# Patient Record
Sex: Male | Born: 1950 | Race: White | Hispanic: No | Marital: Single | Smoking: Former smoker
Health system: Southern US, Community
[De-identification: ages and names within clinical notes are randomized; demographics above are authoritative.]

## PROBLEM LIST (undated history)

## (undated) DIAGNOSIS — I10 Essential (primary) hypertension: Secondary | ICD-10-CM

## (undated) DIAGNOSIS — L409 Psoriasis, unspecified: Secondary | ICD-10-CM

## (undated) DIAGNOSIS — M1990 Unspecified osteoarthritis, unspecified site: Secondary | ICD-10-CM

## (undated) DIAGNOSIS — K759 Inflammatory liver disease, unspecified: Secondary | ICD-10-CM

## (undated) DIAGNOSIS — K219 Gastro-esophageal reflux disease without esophagitis: Secondary | ICD-10-CM

## (undated) DIAGNOSIS — J189 Pneumonia, unspecified organism: Secondary | ICD-10-CM

## (undated) HISTORY — PX: CHOLECYSTECTOMY: SHX55

---

## 2013-01-27 ENCOUNTER — Emergency Department: Payer: Self-pay | Admitting: Emergency Medicine

## 2013-02-03 ENCOUNTER — Emergency Department: Payer: Self-pay | Admitting: Internal Medicine

## 2014-01-16 ENCOUNTER — Emergency Department: Payer: Self-pay | Admitting: Emergency Medicine

## 2014-01-16 LAB — CBC WITH DIFFERENTIAL/PLATELET
BASOS ABS: 0 10*3/uL (ref 0.0–0.1)
Basophil %: 0.4 %
EOS ABS: 0 10*3/uL (ref 0.0–0.7)
EOS PCT: 0.1 %
HCT: 42.7 % (ref 40.0–52.0)
HGB: 14.2 g/dL (ref 13.0–18.0)
Lymphocyte #: 0.5 10*3/uL — ABNORMAL LOW (ref 1.0–3.6)
Lymphocyte %: 8.4 %
MCH: 29.6 pg (ref 26.0–34.0)
MCHC: 33.2 g/dL (ref 32.0–36.0)
MCV: 89 fL (ref 80–100)
Monocyte #: 0.3 x10 3/mm (ref 0.2–1.0)
Monocyte %: 4.2 %
Neutrophil #: 5.5 10*3/uL (ref 1.4–6.5)
Neutrophil %: 86.9 %
PLATELETS: 176 10*3/uL (ref 150–440)
RBC: 4.79 10*6/uL (ref 4.40–5.90)
RDW: 13.9 % (ref 11.5–14.5)
WBC: 6.3 10*3/uL (ref 3.8–10.6)

## 2014-01-16 LAB — COMPREHENSIVE METABOLIC PANEL
ALK PHOS: 165 U/L — AB
AST: 225 U/L — AB (ref 15–37)
Albumin: 3.7 g/dL (ref 3.4–5.0)
Anion Gap: 12 (ref 7–16)
BILIRUBIN TOTAL: 2.8 mg/dL — AB (ref 0.2–1.0)
BUN: 11 mg/dL (ref 7–18)
CALCIUM: 9 mg/dL (ref 8.5–10.1)
Chloride: 105 mmol/L (ref 98–107)
Co2: 25 mmol/L (ref 21–32)
Creatinine: 0.87 mg/dL (ref 0.60–1.30)
EGFR (African American): >60
EGFR (Non-African Amer.): >60
GLUCOSE: 160 mg/dL — AB (ref 65–99)
OSMOLALITY: 286 (ref 275–301)
POTASSIUM: 3.8 mmol/L (ref 3.5–5.1)
SGPT (ALT): 113 U/L — ABNORMAL HIGH
Sodium: 142 mmol/L (ref 136–145)
TOTAL PROTEIN: 7.4 g/dL (ref 6.4–8.2)

## 2014-01-16 LAB — LIPASE, BLOOD: LIPASE: 121 U/L (ref 73–393)

## 2014-01-16 LAB — URINALYSIS, COMPLETE
Bacteria: NONE SEEN
Bilirubin,UR: NEGATIVE
Blood: NEGATIVE
GLUCOSE, UR: NEGATIVE mg/dL (ref 0–75)
Leukocyte Esterase: NEGATIVE
NITRITE: NEGATIVE
Ph: 6 (ref 4.5–8.0)
Protein: NEGATIVE
RBC,UR: 3 /HPF (ref 0–5)
SPECIFIC GRAVITY: 1.021 (ref 1.003–1.030)
WBC UR: 2 /HPF (ref 0–5)

## 2014-01-16 LAB — TROPONIN I

## 2014-02-01 ENCOUNTER — Ambulatory Visit: Payer: Self-pay | Admitting: Gastroenterology

## 2014-03-12 ENCOUNTER — Inpatient Hospital Stay (HOSPITAL_COMMUNITY): Payer: PRIVATE HEALTH INSURANCE

## 2014-03-12 ENCOUNTER — Inpatient Hospital Stay (HOSPITAL_COMMUNITY)
Admission: AD | Admit: 2014-03-12 | Discharge: 2014-03-14 | DRG: 445 | Disposition: A | Discharge status: Home / Self Care | Payer: PRIVATE HEALTH INSURANCE | Source: Other Acute Inpatient Hospital | Attending: Internal Medicine | Admitting: Internal Medicine

## 2014-03-12 ENCOUNTER — Emergency Department: Payer: Self-pay | Admitting: Emergency Medicine

## 2014-03-12 DIAGNOSIS — K219 Gastro-esophageal reflux disease without esophagitis: Secondary | ICD-10-CM | POA: Diagnosis present

## 2014-03-12 DIAGNOSIS — R109 Unspecified abdominal pain: Secondary | ICD-10-CM

## 2014-03-12 DIAGNOSIS — L409 Psoriasis, unspecified: Secondary | ICD-10-CM | POA: Diagnosis present

## 2014-03-12 DIAGNOSIS — Z833 Family history of diabetes mellitus: Secondary | ICD-10-CM

## 2014-03-12 DIAGNOSIS — R1084 Generalized abdominal pain: Secondary | ICD-10-CM

## 2014-03-12 DIAGNOSIS — K760 Fatty (change of) liver, not elsewhere classified: Secondary | ICD-10-CM | POA: Diagnosis present

## 2014-03-12 DIAGNOSIS — R1011 Right upper quadrant pain: Secondary | ICD-10-CM | POA: Diagnosis present

## 2014-03-12 DIAGNOSIS — C642 Malignant neoplasm of left kidney, except renal pelvis: Secondary | ICD-10-CM | POA: Diagnosis present

## 2014-03-12 DIAGNOSIS — R740 Nonspecific elevation of levels of transaminase and lactic acid dehydrogenase [LDH]: Secondary | ICD-10-CM | POA: Diagnosis present

## 2014-03-12 DIAGNOSIS — K8050 Calculus of bile duct without cholangitis or cholecystitis without obstruction: Secondary | ICD-10-CM | POA: Diagnosis present

## 2014-03-12 DIAGNOSIS — Z66 Do not resuscitate: Secondary | ICD-10-CM | POA: Diagnosis present

## 2014-03-12 DIAGNOSIS — K8040 Calculus of bile duct with cholecystitis, unspecified, without obstruction: Secondary | ICD-10-CM

## 2014-03-12 DIAGNOSIS — Z8249 Family history of ischemic heart disease and other diseases of the circulatory system: Secondary | ICD-10-CM

## 2014-03-12 DIAGNOSIS — R739 Hyperglycemia, unspecified: Secondary | ICD-10-CM | POA: Diagnosis present

## 2014-03-12 DIAGNOSIS — R7401 Elevation of levels of liver transaminase levels: Secondary | ICD-10-CM | POA: Diagnosis present

## 2014-03-12 DIAGNOSIS — I10 Essential (primary) hypertension: Secondary | ICD-10-CM | POA: Diagnosis present

## 2014-03-12 HISTORY — DX: Psoriasis, unspecified: L40.9

## 2014-03-12 LAB — URINALYSIS, COMPLETE
Bacteria: NONE SEEN
Bilirubin,UR: NEGATIVE
Blood: NEGATIVE
Glucose,UR: NEGATIVE mg/dL (ref 0–75)
LEUKOCYTE ESTERASE: NEGATIVE
Nitrite: NEGATIVE
PROTEIN: NEGATIVE
Ph: 5 (ref 4.5–8.0)
RBC,UR: 1 /HPF (ref 0–5)
Specific Gravity: 1.031 (ref 1.003–1.030)
WBC UR: 1 /HPF (ref 0–5)

## 2014-03-12 LAB — COMPREHENSIVE METABOLIC PANEL
ALBUMIN: 3.6 g/dL (ref 3.4–5.0)
ALBUMIN: 3.6 g/dL (ref 3.5–5.2)
ALT: 129 U/L — ABNORMAL HIGH (ref 0–53)
ANION GAP: 15 (ref 5–15)
AST: 170 U/L — AB (ref 0–37)
AST: 188 U/L — AB (ref 15–37)
Alkaline Phosphatase: 168 U/L — ABNORMAL HIGH
Alkaline Phosphatase: 184 U/L — ABNORMAL HIGH (ref 39–117)
Anion Gap: 9 (ref 7–16)
BUN: 11 mg/dL (ref 7–18)
BUN: 10 mg/dL (ref 6–23)
Bilirubin,Total: 2.8 mg/dL — ABNORMAL HIGH (ref 0.2–1.0)
CALCIUM: 8.4 mg/dL — AB (ref 8.5–10.1)
CALCIUM: 9.1 mg/dL (ref 8.4–10.5)
CHLORIDE: 107 mmol/L (ref 98–107)
CO2: 25 mEq/L (ref 19–32)
CREATININE: 0.76 mg/dL (ref 0.50–1.35)
Chloride: 101 mEq/L (ref 96–112)
Co2: 26 mmol/L (ref 21–32)
Creatinine: 0.95 mg/dL (ref 0.60–1.30)
EGFR (African American): >60
EGFR (Non-African Amer.): >60
GFR calc Af Amer: >90 mL/min (ref >90)
GFR calc non Af Amer: >90 mL/min (ref >90)
GLUCOSE: 132 mg/dL — AB (ref 65–99)
Glucose, Bld: 120 mg/dL — ABNORMAL HIGH (ref 70–99)
Osmolality: 284 (ref 275–301)
Potassium: 3.7 mmol/L (ref 3.5–5.1)
Potassium: 3.9 mEq/L (ref 3.7–5.3)
SGPT (ALT): 102 U/L — ABNORMAL HIGH
SODIUM: 142 mmol/L (ref 136–145)
Sodium: 141 mEq/L (ref 137–147)
TOTAL PROTEIN: 7.2 g/dL (ref 6.0–8.3)
Total Bilirubin: 2.5 mg/dL — ABNORMAL HIGH (ref 0.3–1.2)
Total Protein: 7.2 g/dL (ref 6.4–8.2)

## 2014-03-12 LAB — CBC WITH DIFFERENTIAL/PLATELET
Basophil #: 0 10*3/uL (ref 0.0–0.1)
Basophil %: 0.4 %
Basophils Absolute: 0 10*3/uL (ref 0.0–0.1)
Basophils Relative: 0 % (ref 0–1)
Eosinophil #: 0 10*3/uL (ref 0.0–0.7)
Eosinophil %: 0.2 %
Eosinophils Absolute: 0 10*3/uL (ref 0.0–0.7)
Eosinophils Relative: 0 % (ref 0–5)
HCT: 40.8 % (ref 39.0–52.0)
HCT: 42.9 % (ref 40.0–52.0)
HEMOGLOBIN: 13.7 g/dL (ref 13.0–17.0)
HGB: 14.3 g/dL (ref 13.0–18.0)
LYMPHS ABS: 0.7 10*3/uL — AB (ref 1.0–3.6)
LYMPHS ABS: 0.9 10*3/uL (ref 0.7–4.0)
Lymphocyte %: 10.2 %
Lymphocytes Relative: 14 % (ref 12–46)
MCH: 29.7 pg (ref 26.0–34.0)
MCH: 29.3 pg (ref 26.0–34.0)
MCHC: 33.4 g/dL (ref 32.0–36.0)
MCHC: 33.6 g/dL (ref 30.0–36.0)
MCV: 87.2 fL (ref 78.0–100.0)
MCV: 89 fL (ref 80–100)
Monocyte #: 0.4 x10 3/mm (ref 0.2–1.0)
Monocyte %: 5.1 %
Monocytes Absolute: 0.4 10*3/uL (ref 0.1–1.0)
Monocytes Relative: 6 % (ref 3–12)
NEUTROS ABS: 5.1 10*3/uL (ref 1.7–7.7)
NEUTROS PCT: 80 % — AB (ref 43–77)
Neutrophil #: 5.9 10*3/uL (ref 1.4–6.5)
Neutrophil %: 84.1 %
Platelet: 164 10*3/uL (ref 150–440)
Platelets: 171 10*3/uL (ref 150–400)
RBC: 4.81 10*6/uL (ref 4.40–5.90)
RBC: 4.68 MIL/uL (ref 4.22–5.81)
RDW: 13.6 % (ref 11.5–14.5)
RDW: 13.1 % (ref 11.5–15.5)
WBC: 7 10*3/uL (ref 3.8–10.6)
WBC: 6.4 10*3/uL (ref 4.0–10.5)

## 2014-03-12 LAB — PHOSPHORUS: PHOSPHORUS: 2.7 mg/dL (ref 2.3–4.6)

## 2014-03-12 LAB — LIPASE, BLOOD: LIPASE: 105 U/L (ref 73–393)

## 2014-03-12 LAB — APTT: aPTT: 34 seconds (ref 24–37)

## 2014-03-12 LAB — MAGNESIUM: MAGNESIUM: 1.9 mg/dL (ref 1.5–2.5)

## 2014-03-12 LAB — PROTIME-INR
INR: 1.17 (ref 0.00–1.49)
PROTHROMBIN TIME: 14.9 s (ref 11.6–15.2)

## 2014-03-12 LAB — TSH: TSH: 0.732 u[IU]/mL (ref 0.350–4.500)

## 2014-03-12 LAB — TROPONIN I

## 2014-03-12 MED ORDER — ONDANSETRON HCL 4 MG/2ML IJ SOLN
4.0000 mg | Freq: Four times a day (QID) | INTRAMUSCULAR | Status: DC | PRN
Start: 2014-03-12 — End: 2014-03-14
  Administered 2014-03-12: 4 mg via INTRAVENOUS
  Filled 2014-03-12: qty 2

## 2014-03-12 MED ORDER — MORPHINE SULFATE 2 MG/ML IJ SOLN
2.0000 mg | INTRAMUSCULAR | Status: DC | PRN
  Administered 2014-03-13: 2 mg via INTRAVENOUS
  Filled 2014-03-12: qty 1

## 2014-03-12 MED ORDER — ACETAMINOPHEN 325 MG PO TABS
650.0000 mg | ORAL_TABLET | Freq: Four times a day (QID) | ORAL | Status: DC | PRN
  Administered 2014-03-13 – 2014-03-14 (×2): 650 mg via ORAL
  Filled 2014-03-12 (×2): qty 2

## 2014-03-12 MED ORDER — HYDRALAZINE HCL 20 MG/ML IJ SOLN: 10.0000 mg | Freq: Four times a day (QID) | INTRAMUSCULAR | Status: DC | PRN

## 2014-03-12 MED ORDER — PANTOPRAZOLE SODIUM 40 MG IV SOLR
40.0000 mg | Freq: Every day | INTRAVENOUS | Status: DC
  Administered 2014-03-12 – 2014-03-14 (×3): 40 mg via INTRAVENOUS
  Filled 2014-03-12 (×3): qty 40

## 2014-03-12 MED ORDER — ONDANSETRON HCL 4 MG PO TABS: 4.0000 mg | ORAL_TABLET | Freq: Four times a day (QID) | ORAL | Status: DC | PRN

## 2014-03-12 MED ORDER — GADOBENATE DIMEGLUMINE 529 MG/ML IV SOLN
20.0000 mL | Freq: Once | INTRAVENOUS | Status: AC | PRN
  Administered 2014-03-12: 20 mL via INTRAVENOUS

## 2014-03-12 MED ORDER — METOPROLOL TARTRATE 1 MG/ML IV SOLN
2.5000 mg | Freq: Three times a day (TID) | INTRAVENOUS | Status: DC
  Administered 2014-03-12 – 2014-03-14 (×4): 2.5 mg via INTRAVENOUS
  Filled 2014-03-12 (×9): qty 5

## 2014-03-12 MED ORDER — HEPARIN SODIUM (PORCINE) 5000 UNIT/ML IJ SOLN
5000.0000 [IU] | Freq: Three times a day (TID) | INTRAMUSCULAR | Status: DC
  Administered 2014-03-12 – 2014-03-14 (×5): 5000 [IU] via SUBCUTANEOUS
  Filled 2014-03-12 (×9): qty 1

## 2014-03-12 MED ORDER — ACETAMINOPHEN 650 MG RE SUPP: 650.0000 mg | Freq: Four times a day (QID) | RECTAL | Status: DC | PRN

## 2014-03-12 MED ORDER — KCL IN DEXTROSE-NACL 20-5-0.45 MEQ/L-%-% IV SOLN
INTRAVENOUS | Status: DC
  Administered 2014-03-12 – 2014-03-13 (×2): via INTRAVENOUS
  Administered 2014-03-14: 50 mL/h via INTRAVENOUS
  Filled 2014-03-12 (×5): qty 1000

## 2014-03-12 NOTE — Progress Notes (Signed)
Pt not in room . No notes in chart, possibly getting MRI. Will return this PM or AM for consult.

## 2014-03-12 NOTE — H&P (Signed)
[facility] History and Physical  Miguel Ingram PFX:[id number] DOB: 15-Jul-1950 DOA: 03/12/2014  Referring physician: Dr. Reita Cliche PCP: No primary provider on file.   Chief Complaint: abd pain, nausea, vomiting  HPI: Miguel Ingram is a 63 y.o. male with PMH of psoriasis, cholecystectomy and HTN (currently not taking any medications); who presented to [facility] due to abd pain, nausea and vomiting. Pain localized in his RUQ and radiated to epigastric region; associated with nausea and vomiting. Patient denies hematemesis. Pain has been present for 1-2 days PTA and worsening. While in ED he was found to have transaminatis and mild elevation of bilirubin, CT demonstrated CBD dilatation and potential stone retention. MRCP was recommended and since not available at [facility] patient was transferred to [facility]. Patient is hemodynamically stable otherwise. No fever, chills, normal WBC's, no icterus.  [place] and incidental left renal mass was appreciated, and most likely representing RCC.   Review of Systems:  Negative except as mentioned on HPI.  PMH: HTN and psoriasis   surgical history: cholecystectomy   Social History:  has no tobacco, alcohol, and drug history on file. patient lives by himself, no married and no children. Able to perform all his ADL's.  Family History HTN, cholesterol and DM; Mother with CHF and Father (heavy smoker) with lung cancer. Otherwise non contributory   Prior to Admission medications   Iburpofen  Multivitamins    Physical Exam: Filed Vitals:   03/12/14 0915  BP: 189/89  Pulse: 70  Temp: 97.9 F (36.6 C)  TempSrc: Oral  Resp: 18  SpO2: 100%    Wt Readings from Last 3 Encounters:  No data found for Wt    General:  Appears calm and comfortable; reports pain on his RUQ is not that bad currently; also w/o N/V at this moment. No fever. Eyes: PERRL, normal lids, irises & conjunctiva; no icterus, no nystagmus ENT: grossly normal hearing, lips &  tongue, no erythema, exudates or any drainage out of ears or nostrils Neck: no LAD, masses or thyromegaly Cardiovascular: RRR, no m/r/g. No LE edema. Respiratory: CTA bilaterally, no w/r/r. Normal respiratory effort. Abdomen: soft, no distension, positive RUQ pain with palpation; positive BS Skin: no rash or induration seen on limited exam Musculoskeletal: grossly normal tone BUE/BLE Psychiatric: grossly normal mood and affect, speech fluent and appropriate Neurologic: grossly non-focal.          Labs on Admission:  Basic Metabolic Panel: No results found for this basename: NA, K, CL, CO2, GLUCOSE, BUN, CREATININE, CALCIUM, MG, PHOS,  in the last 168 hours Liver Function Tests: No results found for this basename: AST, ALT, ALKPHOS, BILITOT, PROT, ALBUMIN,  in the last 168 hours No results found for this basename: LIPASE, AMYLASE,  in the last 168 hours No results found for this basename: AMMONIA,  in the last 168 hours CBC: No results found for this basename: WBC, NEUTROABS, HGB, HCT, MCV, PLT,  in the last 168 hours Cardiac Enzymes: No results found for this basename: CKTOTAL, CKMB, CKMBINDEX, TROPONINI,  in the last 168 hours  BNP (last 3 results) No results found for this basename: PROBNP,  in the last 8760 hours CBG: No results found for this basename: GLUCAP,  in the last 168 hours  Radiological Exams on Admission: Mr 3d Recon At Scanner  03/12/2014   CLINICAL DATA:  Chronic abdominal pain, with acute worsening of symptoms for 18 hr. nausea and vomiting. Pain radiating to back. Suspected choledocholithiasis on CT. Left renal mass.  EXAM: MRI ABDOMEN WITHOUT AND WITH CONTRAST (INCLUDING MRCP)  TECHNIQUE: Multiplanar multisequence MR imaging of the abdomen was performed both before and after the administration of intravenous contrast. Heavily T2-weighted images of the biliary and pancreatic ducts were obtained, and three-dimensional MRCP images were rendered by post processing.   CONTRAST:  28mL MULTIHANCE GADOBENATE DIMEGLUMINE 529 MG/ML IV SOLN  COMPARISON:  CT on 03/12/2014  FINDINGS: Lower chest:  Small hiatal hernia noted.  Hepatobiliary: Diffuse hepatic steatosis noted on chemical shift imaging. A few tiny sub-cm right hepatic lobe cysts are noted, however no liver masses are identified.  Prior cholecystectomy noted. Mild diffuse biliary ductal dilatation is seen with common bile duct measuring approximately 9 mm and diffuse intrahepatic ductal dilatation also seen. A tiny less than 1 cm stone is seen in the distal common bile duct.  Pancreas: No mass, inflammatory changes, or other parenchymal abnormality identified.  Spleen:  Within normal limits in size and appearance.  Adrenal Glands:  No mass identified.  Kidneys: A large heterogeneously enhancing mass is seen involving the mid and lower pole of the left kidney which measures 9.2 x 12.3 cm, consistent with renal cell carcinoma. A few tiny right renal cysts are noted however no right-sided renal masses are identified.  Stomach/Bowel/Peritoneum: Visualized portions within the abdomen are unremarkable.  Vascular/Lymphatic: No pathologically enlarged lymph nodes identified. No other significant abnormality noted.  Other:  None.  Musculoskeletal:  No suspicious bone lesions identified.  IMPRESSION: Prior cholecystectomy. Mild diffuse biliary ductal dilatation, with small less than 1 cm distal common bile duct stone.  Diffuse hepatic steatosis.  12 cm left renal mass, consistent renal cell carcinoma. No evidence of metastatic disease within the abdomen.  Small hiatal hernia.   Electronically Signed   By: Earle Gell M.D.   On: 03/12/2014 15:05   Mr Abd W/wo Cm/mrcp  03/12/2014   CLINICAL DATA:  Chronic abdominal pain, with acute worsening of symptoms for 18 hr. nausea and vomiting. Pain radiating to back. Suspected choledocholithiasis on CT. Left renal mass.  EXAM: MRI ABDOMEN WITHOUT AND WITH CONTRAST (INCLUDING MRCP)  TECHNIQUE:  Multiplanar multisequence MR imaging of the abdomen was performed both before and after the administration of intravenous contrast. Heavily T2-weighted images of the biliary and pancreatic ducts were obtained, and three-dimensional MRCP images were rendered by post processing.  CONTRAST:  41mL MULTIHANCE GADOBENATE DIMEGLUMINE 529 MG/ML IV SOLN  COMPARISON:  CT on 03/12/2014  FINDINGS: Lower chest:  Small hiatal hernia noted.  Hepatobiliary: Diffuse hepatic steatosis noted on chemical shift imaging. A few tiny sub-cm right hepatic lobe cysts are noted, however no liver masses are identified.  Prior cholecystectomy noted. Mild diffuse biliary ductal dilatation is seen with common bile duct measuring approximately 9 mm and diffuse intrahepatic ductal dilatation also seen. A tiny less than 1 cm stone is seen in the distal common bile duct.  Pancreas: No mass, inflammatory changes, or other parenchymal abnormality identified.  Spleen:  Within normal limits in size and appearance.  Adrenal Glands:  No mass identified.  Kidneys: A large heterogeneously enhancing mass is seen involving the mid and lower pole of the left kidney which measures 9.2 x 12.3 cm, consistent with renal cell carcinoma. A few tiny right renal cysts are noted however no right-sided renal masses are identified.  Stomach/Bowel/Peritoneum: Visualized portions within the abdomen are unremarkable.  Vascular/Lymphatic: No pathologically enlarged lymph nodes identified. No other significant abnormality noted.  Other:  None.  Musculoskeletal:  No suspicious bone lesions  identified.  IMPRESSION: Prior cholecystectomy. Mild diffuse biliary ductal dilatation, with small less than 1 cm distal common bile duct stone.  Diffuse hepatic steatosis.  12 cm left renal mass, consistent renal cell carcinoma. No evidence of metastatic disease within the abdomen.  Small hiatal hernia.   Electronically Signed   By: Earle Gell M.D.   On: 03/12/2014 15:05    EKG:  None     Assessment/Plan 1-Abdominal pain, right upper quadrant, N/V: appears to be secondary to choledocholithiasis. Patient s/p cholecystectomy in the past. MRCP and CT abdomen demonstrating CBD dilatation and presence of a distal stone. -no fever, no WBC's; will hold on antibiotics -GI consulted as patient might required ERCP and sphincterectomy   -PRN antiemetics -PRN analgesics  -Supportive care -NPO except for ice-chips and sips  2-Transaminitis: AST 188, ALT 102 and alkaline phosphatase 168. -secondary to #1 -will monitor -no icterus on exam -will follow GI rec's  3-Benign essential HTN: will use low dose lopressor and PRN hydralazine  4-GERD and GI prophylaxis: will use protonix  5-incidental left renal cell carcinoma: approx size is 12cm -urology contacted and the plan is to set up immediate follow up as an outpatient for work up and decision for surgery -will need Bone scan as an outpatient -Calcium level WNL -no hydronephrosis  -Secretary on the floor to make appointment prior to discharge Phone [phone]  6-Hyperglycemia: w/o hx of diabetes -will check A1C   7-hepatic steatosis: incidental finding: will check lipid profile.   GI (Dr. Amedeo Plenty) Urology (Dr. Ishmael Holter)   Code Status: DNR DVT Prophylaxis:Heparin Family Communication: no family at bedside Disposition Plan: LOS > 2 midnights, inpatient, Med-Surg  Time spent: 44 minutes  Barton Dubois [facility] Pager [phone]

## 2014-03-12 NOTE — Consult Note (Signed)
[facility] Consult Note  Referring Provider: No ref. provider found Primary Care Physician:  No primary provider on file. Primary Gastroenterologist:  Dr.  Laurel Dimmer Complaint: Epigastric abdominal pain nausea and vomiting HPI: Miguel Ingram is an 63 y.o. white male  transferred from [facility] he presented with epigastric pain nausea and vomiting. He had elevated liver function tests, a normal lipase and CT suggestion of a distal common bile duct stone. He is status post cholecystectomy 2 or 3 years ago. He was also found to have a large left renal mass. He was transferred here and MRCP confirmed both a distal CBD stone in the left renal mass consistent with renal cell carcinoma. He is currently essentially pain-free after narcotics. No past medical history on file.  No past surgical history on file.  Medications Prior to Admission  Medication Sig Dispense Refill  . ibuprofen (ADVIL,MOTRIN) 200 MG tablet Take 200 mg by mouth every 6 (six) hours as needed for moderate pain.      . Multiple Vitamin (MULTIVITAMIN WITH MINERALS) TABS tablet Take 1 tablet by mouth daily.        Allergies:  Allergies  Allergen Reactions  . Shrimp [Shellfish Allergy] Nausea And Vomiting    No family history on file.  Social History:  has no tobacco, alcohol, and drug history on file.  Review of Systems: negative except as above   Blood pressure 148/83, pulse 68, temperature 97.9 F (36.6 C), temperature source Oral, resp. rate 18, height 6' (1.829 m), weight 97.523 kg (215 lb), SpO2 100.00%. Head: Normocephalic, without obvious abnormality, atraumatic Neck: no adenopathy, no carotid bruit, no JVD, supple, symmetrical, trachea midline and thyroid not enlarged, symmetric, no tenderness/mass/nodules Resp: clear to auscultation bilaterally Cardio: regular rate and rhythm, S1, S2 normal, no murmur, click, rub or gallop GI: Abdomen soft minimally tender in the epigastric, no hepatosplenomegaly  mass or guarding Extremities: extremities normal, atraumatic, no cyanosis or edema  Results for orders placed during the hospital encounter of 03/12/14 (from the past 48 hour(s))  CBC WITH DIFFERENTIAL     Status: Abnormal   Collection Time    03/12/14  4:40 PM      Result Value Ref Range   WBC 6.4  4.0 - 10.5 K/uL   RBC 4.68  4.22 - 5.81 MIL/uL   Hemoglobin 13.7  13.0 - 17.0 g/dL   HCT 40.8  39.0 - 52.0 %   MCV 87.2  78.0 - 100.0 fL   MCH 29.3  26.0 - 34.0 pg   MCHC 33.6  30.0 - 36.0 g/dL   RDW 13.1  11.5 - 15.5 %   Platelets 171  150 - 400 K/uL   Neutrophils Relative % 80 (*) 43 - 77 %   Neutro Abs 5.1  1.7 - 7.7 K/uL   Lymphocytes Relative 14  12 - 46 %   Lymphs Abs 0.9  0.7 - 4.0 K/uL   Monocytes Relative 6  3 - 12 %   Monocytes Absolute 0.4  0.1 - 1.0 K/uL   Eosinophils Relative 0  0 - 5 %   Eosinophils Absolute 0.0  0.0 - 0.7 K/uL   Basophils Relative 0  0 - 1 %   Basophils Absolute 0.0  0.0 - 0.1 K/uL  PROTIME-INR     Status: None   Collection Time    03/12/14  4:40 PM      Result Value Ref Range   Prothrombin Time 14.9  11.6 - 15.2 seconds  INR 1.17  0.00 - 1.49  APTT     Status: None   Collection Time    03/12/14  4:40 PM      Result Value Ref Range   aPTT 34  24 - 37 seconds   Mr 3d Recon At Scanner  03/12/2014   CLINICAL DATA:  Chronic abdominal pain, with acute worsening of symptoms for 18 hr. nausea and vomiting. Pain radiating to back. Suspected choledocholithiasis on CT. Left renal mass.  EXAM: MRI ABDOMEN WITHOUT AND WITH CONTRAST (INCLUDING MRCP)  TECHNIQUE: Multiplanar multisequence MR imaging of the abdomen was performed both before and after the administration of intravenous contrast. Heavily T2-weighted images of the biliary and pancreatic ducts were obtained, and three-dimensional MRCP images were rendered by post processing.  CONTRAST:  57mL MULTIHANCE GADOBENATE DIMEGLUMINE 529 MG/ML IV SOLN  COMPARISON:  CT on 03/12/2014  FINDINGS: Lower chest:   Small hiatal hernia noted.  Hepatobiliary: Diffuse hepatic steatosis noted on chemical shift imaging. A few tiny sub-cm right hepatic lobe cysts are noted, however no liver masses are identified.  Prior cholecystectomy noted. Mild diffuse biliary ductal dilatation is seen with common bile duct measuring approximately 9 mm and diffuse intrahepatic ductal dilatation also seen. A tiny less than 1 cm stone is seen in the distal common bile duct.  Pancreas: No mass, inflammatory changes, or other parenchymal abnormality identified.  Spleen:  Within normal limits in size and appearance.  Adrenal Glands:  No mass identified.  Kidneys: A large heterogeneously enhancing mass is seen involving the mid and lower pole of the left kidney which measures 9.2 x 12.3 cm, consistent with renal cell carcinoma. A few tiny right renal cysts are noted however no right-sided renal masses are identified.  Stomach/Bowel/Peritoneum: Visualized portions within the abdomen are unremarkable.  Vascular/Lymphatic: No pathologically enlarged lymph nodes identified. No other significant abnormality noted.  Other:  None.  Musculoskeletal:  No suspicious bone lesions identified.  IMPRESSION: Prior cholecystectomy. Mild diffuse biliary ductal dilatation, with small less than 1 cm distal common bile duct stone.  Diffuse hepatic steatosis.  12 cm left renal mass, consistent renal cell carcinoma. No evidence of metastatic disease within the abdomen.  Small hiatal hernia.   Electronically Signed   By: Earle Gell M.D.   On: 03/12/2014 15:05   Mr Abd W/wo Cm/mrcp  03/12/2014   CLINICAL DATA:  Chronic abdominal pain, with acute worsening of symptoms for 18 hr. nausea and vomiting. Pain radiating to back. Suspected choledocholithiasis on CT. Left renal mass.  EXAM: MRI ABDOMEN WITHOUT AND WITH CONTRAST (INCLUDING MRCP)  TECHNIQUE: Multiplanar multisequence MR imaging of the abdomen was performed both before and after the administration of intravenous  contrast. Heavily T2-weighted images of the biliary and pancreatic ducts were obtained, and three-dimensional MRCP images were rendered by post processing.  CONTRAST:  16mL MULTIHANCE GADOBENATE DIMEGLUMINE 529 MG/ML IV SOLN  COMPARISON:  CT on 03/12/2014  FINDINGS: Lower chest:  Small hiatal hernia noted.  Hepatobiliary: Diffuse hepatic steatosis noted on chemical shift imaging. A few tiny sub-cm right hepatic lobe cysts are noted, however no liver masses are identified.  Prior cholecystectomy noted. Mild diffuse biliary ductal dilatation is seen with common bile duct measuring approximately 9 mm and diffuse intrahepatic ductal dilatation also seen. A tiny less than 1 cm stone is seen in the distal common bile duct.  Pancreas: No mass, inflammatory changes, or other parenchymal abnormality identified.  Spleen:  Within normal limits in size and appearance.  Adrenal  Glands:  No mass identified.  Kidneys: A large heterogeneously enhancing mass is seen involving the mid and lower pole of the left kidney which measures 9.2 x 12.3 cm, consistent with renal cell carcinoma. A few tiny right renal cysts are noted however no right-sided renal masses are identified.  Stomach/Bowel/Peritoneum: Visualized portions within the abdomen are unremarkable.  Vascular/Lymphatic: No pathologically enlarged lymph nodes identified. No other significant abnormality noted.  Other:  None.  Musculoskeletal:  No suspicious bone lesions identified.  IMPRESSION: Prior cholecystectomy. Mild diffuse biliary ductal dilatation, with small less than 1 cm distal common bile duct stone.  Diffuse hepatic steatosis.  12 cm left renal mass, consistent renal cell carcinoma. No evidence of metastatic disease within the abdomen.  Small hiatal hernia.   Electronically Signed   By: Earle Gell M.D.   On: 03/12/2014 15:05    Assessment: 1. Common bile duct stone 2. Left renal mass suspicious for renal cell carcinoma Plan:  1. Will plan ERCP hopefully  tomorrow or Tuesday at the latest. Risks, rationale and alternatives were explained the patient and he wishes to proceed 2. Workup of renal mass per urology who is been contacted by the hospitalist. Madysyn Hanken,Bon C 03/12/2014, 5:54 PM

## 2014-03-13 ENCOUNTER — Inpatient Hospital Stay (HOSPITAL_COMMUNITY): Payer: PRIVATE HEALTH INSURANCE

## 2014-03-13 ENCOUNTER — Encounter (HOSPITAL_COMMUNITY): Payer: Self-pay | Admitting: *Deleted

## 2014-03-13 ENCOUNTER — Inpatient Hospital Stay (HOSPITAL_COMMUNITY): Payer: PRIVATE HEALTH INSURANCE | Admitting: Certified Registered"

## 2014-03-13 ENCOUNTER — Encounter (HOSPITAL_COMMUNITY)
Admission: AD | Disposition: A | Discharge status: Home / Self Care | Payer: Self-pay | Source: Other Acute Inpatient Hospital | Attending: Internal Medicine

## 2014-03-13 ENCOUNTER — Encounter (HOSPITAL_COMMUNITY): Payer: PRIVATE HEALTH INSURANCE | Admitting: Certified Registered"

## 2014-03-13 HISTORY — PX: ERCP: SHX5425

## 2014-03-13 LAB — COMPREHENSIVE METABOLIC PANEL
ALBUMIN: 3.4 g/dL — AB (ref 3.5–5.2)
ALT: 102 U/L — ABNORMAL HIGH (ref 0–53)
ANION GAP: 10 (ref 5–15)
AST: 104 U/L — AB (ref 0–37)
Alkaline Phosphatase: 167 U/L — ABNORMAL HIGH (ref 39–117)
BUN: 10 mg/dL (ref 6–23)
CALCIUM: 9 mg/dL (ref 8.4–10.5)
CO2: 28 mEq/L (ref 19–32)
CREATININE: 0.99 mg/dL (ref 0.50–1.35)
Chloride: 104 mEq/L (ref 96–112)
GFR calc Af Amer: >90 mL/min (ref >90)
GFR calc non Af Amer: 85 mL/min — ABNORMAL LOW (ref >90)
Glucose, Bld: 104 mg/dL — ABNORMAL HIGH (ref 70–99)
Potassium: 4.1 mEq/L (ref 3.7–5.3)
Sodium: 142 mEq/L (ref 137–147)
Total Bilirubin: 1.8 mg/dL — ABNORMAL HIGH (ref 0.3–1.2)
Total Protein: 6.5 g/dL (ref 6.0–8.3)

## 2014-03-13 LAB — LIPID PANEL
Cholesterol: 141 mg/dL (ref 0–200)
HDL: 42 mg/dL (ref >39)
LDL Cholesterol: 77 mg/dL (ref 0–99)
TRIGLYCERIDES: 112 mg/dL (ref <150)
Total CHOL/HDL Ratio: 3.4 RATIO
VLDL: 22 mg/dL (ref 0–40)

## 2014-03-13 LAB — CBC
HCT: 40.9 % (ref 39.0–52.0)
Hemoglobin: 13.4 g/dL (ref 13.0–17.0)
MCH: 29.8 pg (ref 26.0–34.0)
MCHC: 32.8 g/dL (ref 30.0–36.0)
MCV: 90.9 fL (ref 78.0–100.0)
PLATELETS: 154 10*3/uL (ref 150–400)
RBC: 4.5 MIL/uL (ref 4.22–5.81)
RDW: 13.3 % (ref 11.5–15.5)
WBC: 4.9 10*3/uL (ref 4.0–10.5)

## 2014-03-13 LAB — HEMOGLOBIN A1C
HEMOGLOBIN A1C: 5.9 % — AB (ref <5.7)
MEAN PLASMA GLUCOSE: 123 mg/dL — AB (ref <117)

## 2014-03-13 SURGERY — ERCP, WITH INTERVENTION IF INDICATED
Anesthesia: General

## 2014-03-13 MED ORDER — PHENYLEPHRINE HCL 10 MG/ML IJ SOLN
INTRAMUSCULAR | Status: DC | PRN
  Administered 2014-03-13: 80 ug via INTRAVENOUS

## 2014-03-13 MED ORDER — LACTATED RINGERS IV SOLN
INTRAVENOUS | Status: DC
  Administered 2014-03-13: 1000 mL via INTRAVENOUS

## 2014-03-13 MED ORDER — LIDOCAINE HCL (CARDIAC) 20 MG/ML IV SOLN
INTRAVENOUS | Status: DC | PRN
  Administered 2014-03-13: 100 mg via INTRAVENOUS
  Administered 2014-03-13: 60 mg via INTRATRACHEAL

## 2014-03-13 MED ORDER — SUCCINYLCHOLINE CHLORIDE 20 MG/ML IJ SOLN
INTRAMUSCULAR | Status: DC | PRN
  Administered 2014-03-13: 100 mg via INTRAVENOUS

## 2014-03-13 MED ORDER — PROPOFOL 10 MG/ML IV BOLUS
INTRAVENOUS | Status: DC | PRN
  Administered 2014-03-13: 160 mg via INTRAVENOUS

## 2014-03-13 MED ORDER — ONDANSETRON HCL 4 MG/2ML IJ SOLN
INTRAMUSCULAR | Status: DC | PRN
  Administered 2014-03-13: 4 mg via INTRAVENOUS

## 2014-03-13 MED ORDER — SODIUM CHLORIDE 0.9 % IV SOLN
INTRAVENOUS | Status: DC | PRN
  Administered 2014-03-13: 13:00:00

## 2014-03-13 MED ORDER — MIDAZOLAM HCL 5 MG/5ML IJ SOLN
INTRAMUSCULAR | Status: DC | PRN
  Administered 2014-03-13: 2 mg via INTRAVENOUS

## 2014-03-13 MED ORDER — SODIUM CHLORIDE 0.9 % IV SOLN
1.5000 g | Freq: Once | INTRAVENOUS | Status: AC
  Administered 2014-03-13: 1.5 g via INTRAVENOUS
  Filled 2014-03-13: qty 1.5

## 2014-03-13 MED ORDER — SODIUM CHLORIDE 0.9 % IV SOLN: INTRAVENOUS | Status: DC

## 2014-03-13 MED ORDER — EPHEDRINE SULFATE 50 MG/ML IJ SOLN
INTRAMUSCULAR | Status: DC | PRN
  Administered 2014-03-13 (×2): 10 mg via INTRAVENOUS

## 2014-03-13 MED ORDER — FENTANYL CITRATE 0.05 MG/ML IJ SOLN
INTRAMUSCULAR | Status: DC | PRN
  Administered 2014-03-13: 50 ug via INTRAVENOUS

## 2014-03-13 NOTE — Anesthesia Postprocedure Evaluation (Signed)
  Anesthesia Post-op Note  Patient: Miguel Ingram  Procedure(s) Performed: Procedure(s): ENDOSCOPIC RETROGRADE CHOLANGIOPANCREATOGRAPHY (ERCP) (N/A)  Patient Location: PACU  Anesthesia Type:MAC  Level of Consciousness: awake, alert , oriented and patient cooperative  Airway and Oxygen Therapy: Patient Spontanous Breathing  Post-op Pain: none  Post-op Assessment: Post-op Vital signs reviewed, Patient's Cardiovascular Status Stable, Respiratory Function Stable, Patent Airway and No signs of Nausea or vomiting  Post-op Vital Signs: stable  Last Vitals:  Filed Vitals:   03/13/14 1330  BP: 141/81  Pulse: 76  Temp:   Resp: 11    Complications: No apparent anesthesia complications

## 2014-03-13 NOTE — Anesthesia Procedure Notes (Signed)
Procedure Name: Intubation Date/Time: 03/13/2014 12:05 PM Performed by: Julian Reil Pre-anesthesia Checklist: Patient identified, Emergency Drugs available, Suction available and Patient being monitored Patient Re-evaluated:Patient Re-evaluated prior to inductionOxygen Delivery Method: Circle system utilized Preoxygenation: Pre-oxygenation with 100% oxygen Intubation Type: IV induction Ventilation: Mask ventilation without difficulty Laryngoscope Size: Mac and 4 Grade View: Grade I Tube type: Oral Tube size: 7.5 mm Number of attempts: 1 Airway Equipment and Method: Stylet and LTA kit utilized Placement Confirmation: ETT inserted through vocal cords under direct vision,  positive ETCO2 and breath sounds checked- equal and bilateral Secured at: 23 cm Tube secured with: Tape Dental Injury: Teeth and Oropharynx as per pre-operative assessment

## 2014-03-13 NOTE — Op Note (Signed)
[facility] [place], [address]   ERCP PROCEDURE REPORT  PATIENT: Miguel Ingram, Miguel Ingram  MR# :[id number] BIRTHDATE: November 16, 1950  GENDER: male ENDOSCOPIST: Clarene Essex, MD REFERRED BY: PROCEDURE DATE:  03/13/2014 PROCEDURE:   ERCP with sphincterotomy/papillotomy and ERCP with removal of calculus/calculi ASA CLASS:    2 INDICATIONS: CBD stone MEDICATIONS:   general anesthesia TOPICAL ANESTHETIC:  none  DESCRIPTION OF PROCEDURE:   After the risks benefits and alternatives of the procedure were thoroughly explained, informed consent was obtained.  The Pentax Ercp Scope [id number]  endoscope was introduced through the mouth and advanced to the second portion of the duodenum .a normal appearing ampulla was brought into view and using the triple-lumen sphincterotome loaded with the JAG Jagwire was to get the wire to go towards the pancreas but despite multiple repositionings we could not cannulate the CBD and at one  point minimal pancreatic duct injection was obtained and we elected to leave the wire in the pancreas and place the smaller sphincterotome and the smaller wire and with this maneuver deep  selective cannulation of the CBD was obtained and the first wire in the pancreas was removed and dye was injected into the CBD which confirmed  the stone and then we went ahead with the sphincterotomy in the customary fashion until we had adequate biliary drainage and couldn't get the fully bowed sphincterotome easily in and out of the duct and then we exchanged the sphincter tone with the 12-15 mm adjustable balloon and the stone was removed on the first attempt and multiple balloon pull-through as were negative with minimal resistance in passing the sphincterotomy site we then proceeded with an occlusion cholangiogram which was normal and the balloon was pulled through the CBD and ampulla 2 more times and there was no residual abnormality and the wire and  balloon were removed and there was  sluggish but slow drainage and the scope was removed and the patient tolerated the procedure well there was no obvious immediate complication     COMPLICATIONS:   none  ENDOSCOPIC IMPRESSION:1 normal ampulla 2. pancreatic duct with few ire advancement and 1 minimal injection as aboveand we removed after CBD cannulation as above 3. 1 CBD stone status post sphincterotomy and balloon pull-through with negative occlusion cholangiogram as above and sluggish drainage at the end of the procedure as above RECOMMENDATIONS:observe for delayed complications might expect liver tests to increase post procedure but should return to normal as edema decreases and if no signs of pancreatitis or other complication we'll slowly advance diet and hopefully home soon  _______________________________ eSigned:  Clarene Essex, MD 03/13/2014 1:03 PM   CC:

## 2014-03-13 NOTE — Anesthesia Preprocedure Evaluation (Signed)
Anesthesia Evaluation  Patient identified by MRN, date of birth, ID band Patient awake    Reviewed: Allergy & Precautions, H&P , NPO status , Patient's Chart, lab work & pertinent test results  Airway       Dental   Pulmonary former smoker,          Cardiovascular hypertension,     Neuro/Psych    GI/Hepatic   Endo/Other    Renal/GU      Musculoskeletal   Abdominal   Peds  Hematology   Anesthesia Other Findings   Reproductive/Obstetrics                           Anesthesia Physical Anesthesia Plan  ASA: II  Anesthesia Plan: General   Post-op Pain Management:    Induction: Intravenous  Airway Management Planned: Oral ETT  Additional Equipment:   Intra-op Plan:   Post-operative Plan: Extubation in OR  Informed Consent: I have reviewed the patients History and Physical, chart, labs and discussed the procedure including the risks, benefits and alternatives for the proposed anesthesia with the patient or authorized representative who has indicated his/her understanding and acceptance.     Plan Discussed with: CRNA, Anesthesiologist and Surgeon  Anesthesia Plan Comments:         Anesthesia Quick Evaluation

## 2014-03-13 NOTE — Progress Notes (Signed)
Patient Demographics  Miguel Ingram, is a 63 y.o. male, DOB - [birth year], YKZ:[id number]  Admit date - 03/12/2014   Admitting Physician No admitting provider for patient encounter.  Outpatient Primary MD for the patient is No primary provider on file.  LOS - 1   No chief complaint on file.       Subjective:   Miguel Ingram today has, No headache, No chest pain, much improved epigastric abdominal pain - No Nausea, No new weakness tingling or numbness, No Cough - SOB.    Assessment & Plan    1. Choledocholithiasis - MRCP noted, GI on board due for ERCP. Continue supportive care with bowel rest, IV fluids pain control and Unasyn. Will likely require cholecystectomy at some point.     2. Incidental finding of left renal mass suspicious for renal cell cancer. Case was discussed with urologist Dr. Jeffie Pollock upon admission by the admitting physician, he would like to follow with the patient post discharge within a week. Secretary requested to set up appointment today.     3. Essential hypertension. Stable continue low-dose Lopressor along with as needed hydralazine.     4. GERD. Continue PPI.     5. Mild postprandial hyperglycemia. A1c 5.9. Continue monitoring outpatient. Will discharge on low carb diet.     6. Fatty liver. GI following. Lipid panel stable. Low carb diet with exercise regimen upon discharge. Outpatient GI followup post discharge as well.      Code Status: Full  Family Communication: None present  Disposition Plan: Home   Procedures MRCP, likely ERCP soon   Consults  GI, Dr. Jeffie Pollock urology over the phone by admitting physician   Medications  Scheduled Meds: . heparin  5,000 Units Subcutaneous 3 times per day  . metoprolol  2.5 mg Intravenous 3 times per day    . pantoprazole (PROTONIX) IV  40 mg Intravenous Daily   Continuous Infusions: . dextrose 5 % and 0.45 % NaCl with KCl 20 mEq/L 50 mL/hr at 03/12/14 1432   PRN Meds:.acetaminophen, acetaminophen, hydrALAZINE, morphine injection, ondansetron (ZOFRAN) IV, ondansetron  DVT Prophylaxis    Heparin   Lab Results  Component Value Date   PLT 154 03/13/2014    Antibiotics    Anti-infectives   Start     Dose/Rate Route Frequency Ordered Stop   03/13/14 0830  ampicillin-sulbactam (UNASYN) 1.5 g in sodium chloride 0.9 % 50 mL IVPB     1.5 g 100 mL/hr over 30 Minutes Intravenous  Once 03/13/14 0823 03/13/14 0922          Objective:   Filed Vitals:   03/12/14 1717 03/12/14 2014 03/13/14 0615 03/13/14 0626  BP: 148/83 126/68 140/76   Pulse: 68 62 56 61  Temp:  98.6 F (37 C) 98 F (36.7 C)   TempSrc:  Oral Oral   Resp:  18 18   Height:      Weight:      SpO2:  97% 93%     Wt Readings from Last 3 Encounters:  03/12/14 97.523 kg (215 lb)  03/12/14 97.523 kg (215 lb)     Intake/Output Summary (Last 24 hours) at 03/13/14 1023 Last data filed at 03/12/14 1800  Gross per 24 hour  Intake  225 ml  Output    400 ml  Net   -175 ml     Physical Exam  Awake Alert, Oriented X 3, No new F.N deficits, Normal affect [place].AT,PERRAL Supple Neck,No JVD, No cervical lymphadenopathy appriciated.  Symmetrical Chest wall movement, Good air movement bilaterally, CTAB RRR,No Gallops,Rubs or new Murmurs, No Parasternal Heave +ve B.Sounds, Abd Soft, mild epigastric tenderness, No organomegaly appriciated, No rebound - guarding or rigidity. No Cyanosis, Clubbing or edema, No new Rash or bruise     Data Review   Micro Results No results found for this or any previous visit (from the past 240 hour(s)).  Radiology Reports Mr 3d Recon At Scanner  03/12/2014   CLINICAL DATA:  Chronic abdominal pain, with acute worsening of symptoms for 18 hr. nausea and vomiting. Pain radiating to back.  Suspected choledocholithiasis on CT. Left renal mass.  EXAM: MRI ABDOMEN WITHOUT AND WITH CONTRAST (INCLUDING MRCP)  TECHNIQUE: Multiplanar multisequence MR imaging of the abdomen was performed both before and after the administration of intravenous contrast. Heavily T2-weighted images of the biliary and pancreatic ducts were obtained, and three-dimensional MRCP images were rendered by post processing.  CONTRAST:  69mL MULTIHANCE GADOBENATE DIMEGLUMINE 529 MG/ML IV SOLN  COMPARISON:  CT on 03/12/2014  FINDINGS: Lower chest:  Small hiatal hernia noted.  Hepatobiliary: Diffuse hepatic steatosis noted on chemical shift imaging. A few tiny sub-cm right hepatic lobe cysts are noted, however no liver masses are identified.  Prior cholecystectomy noted. Mild diffuse biliary ductal dilatation is seen with common bile duct measuring approximately 9 mm and diffuse intrahepatic ductal dilatation also seen. A tiny less than 1 cm stone is seen in the distal common bile duct.  Pancreas: No mass, inflammatory changes, or other parenchymal abnormality identified.  Spleen:  Within normal limits in size and appearance.  Adrenal Glands:  No mass identified.  Kidneys: A large heterogeneously enhancing mass is seen involving the mid and lower pole of the left kidney which measures 9.2 x 12.3 cm, consistent with renal cell carcinoma. A few tiny right renal cysts are noted however no right-sided renal masses are identified.  Stomach/Bowel/Peritoneum: Visualized portions within the abdomen are unremarkable.  Vascular/Lymphatic: No pathologically enlarged lymph nodes identified. No other significant abnormality noted.  Other:  None.  Musculoskeletal:  No suspicious bone lesions identified.  IMPRESSION: Prior cholecystectomy. Mild diffuse biliary ductal dilatation, with small less than 1 cm distal common bile duct stone.  Diffuse hepatic steatosis.  12 cm left renal mass, consistent renal cell carcinoma. No evidence of metastatic disease  within the abdomen.  Small hiatal hernia.   Electronically Signed   By: Earle Gell M.D.   On: 03/12/2014 15:05   Mr Abd W/wo Cm/mrcp  03/12/2014   CLINICAL DATA:  Chronic abdominal pain, with acute worsening of symptoms for 18 hr. nausea and vomiting. Pain radiating to back. Suspected choledocholithiasis on CT. Left renal mass.  EXAM: MRI ABDOMEN WITHOUT AND WITH CONTRAST (INCLUDING MRCP)  TECHNIQUE: Multiplanar multisequence MR imaging of the abdomen was performed both before and after the administration of intravenous contrast. Heavily T2-weighted images of the biliary and pancreatic ducts were obtained, and three-dimensional MRCP images were rendered by post processing.  CONTRAST:  82mL MULTIHANCE GADOBENATE DIMEGLUMINE 529 MG/ML IV SOLN  COMPARISON:  CT on 03/12/2014  FINDINGS: Lower chest:  Small hiatal hernia noted.  Hepatobiliary: Diffuse hepatic steatosis noted on chemical shift imaging. A few tiny sub-cm right hepatic lobe cysts are noted, however no liver  masses are identified.  Prior cholecystectomy noted. Mild diffuse biliary ductal dilatation is seen with common bile duct measuring approximately 9 mm and diffuse intrahepatic ductal dilatation also seen. A tiny less than 1 cm stone is seen in the distal common bile duct.  Pancreas: No mass, inflammatory changes, or other parenchymal abnormality identified.  Spleen:  Within normal limits in size and appearance.  Adrenal Glands:  No mass identified.  Kidneys: A large heterogeneously enhancing mass is seen involving the mid and lower pole of the left kidney which measures 9.2 x 12.3 cm, consistent with renal cell carcinoma. A few tiny right renal cysts are noted however no right-sided renal masses are identified.  Stomach/Bowel/Peritoneum: Visualized portions within the abdomen are unremarkable.  Vascular/Lymphatic: No pathologically enlarged lymph nodes identified. No other significant abnormality noted.  Other:  None.  Musculoskeletal:  No suspicious  bone lesions identified.  IMPRESSION: Prior cholecystectomy. Mild diffuse biliary ductal dilatation, with small less than 1 cm distal common bile duct stone.  Diffuse hepatic steatosis.  12 cm left renal mass, consistent renal cell carcinoma. No evidence of metastatic disease within the abdomen.  Small hiatal hernia.   Electronically Signed   By: Earle Gell M.D.   On: 03/12/2014 15:05     CBC  Recent Labs Lab 03/12/14 1640 03/13/14 0610  WBC 6.4 4.9  HGB 13.7 13.4  HCT 40.8 40.9  PLT 171 154  MCV 87.2 90.9  MCH 29.3 29.8  MCHC 33.6 32.8  RDW 13.1 13.3  LYMPHSABS 0.9  --   MONOABS 0.4  --   EOSABS 0.0  --   BASOSABS 0.0  --     Chemistries   Recent Labs Lab 03/12/14 1640 03/13/14 0610  NA 141 142  K 3.9 4.1  CL 101 104  CO2 25 28  GLUCOSE 120* 104*  BUN 10 10  CREATININE 0.76 0.99  CALCIUM 9.1 9.0  MG 1.9  --   AST 170* 104*  ALT 129* 102*  ALKPHOS 184* 167*  BILITOT 2.5* 1.8*   ------------------------------------------------------------------------------------------------------------------ estimated creatinine clearance is 92.5 ml/min (by C-G formula based on Cr of 0.99). ------------------------------------------------------------------------------------------------------------------  Recent Labs  03/12/14 1640  HGBA1C 5.9*   ------------------------------------------------------------------------------------------------------------------  Recent Labs  03/13/14 0610  CHOL 141  HDL 42  LDLCALC 77  TRIG 112  CHOLHDL 3.4   ------------------------------------------------------------------------------------------------------------------  Recent Labs  03/12/14 1640  TSH 0.732   ------------------------------------------------------------------------------------------------------------------ No results found for this basename: VITAMINB12, FOLATE, FERRITIN, TIBC, IRON, RETICCTPCT,  in the last 72 hours  Coagulation profile  Recent Labs Lab  03/12/14 1640  INR 1.17    No results found for this basename: DDIMER,  in the last 72 hours  Cardiac Enzymes No results found for this basename: CK, CKMB, TROPONINI, MYOGLOBIN,  in the last 168 hours ------------------------------------------------------------------------------------------------------------------ No components found with this basename: POCBNP,      Time Spent in minutes  35   Deaglan Lile K M.D on 03/13/2014 at 10:23 AM  Between 7am to 7pm - Pager - [phone]  After 7pm go to [email or web address] - password [email or web address]  And look for the night coverage person covering for me after hours  [facility] Office  [phone]

## 2014-03-13 NOTE — Transfer of Care (Signed)
Immediate Anesthesia Transfer of Care Note  Patient: Miguel Ingram  Procedure(s) Performed: Procedure(s): ENDOSCOPIC RETROGRADE CHOLANGIOPANCREATOGRAPHY (ERCP) (N/A)  Patient Location: PACU  Anesthesia Type:General  Level of Consciousness: sedated and responds to stimulation  Airway & Oxygen Therapy: Patient Spontanous Breathing and Patient connected to nasal cannula oxygen  Post-op Assessment: Report given to PACU RN, Post -op Vital signs reviewed and stable and Patient moving all extremities  Post vital signs: Reviewed and stable  Complications: No apparent anesthesia complications

## 2014-03-13 NOTE — Progress Notes (Signed)
Miguel Ingram 12:01 PM  Subjective: Patient seen and examined and hospital computer chart reviewed and case discussed with my partner Dr. Amedeo Plenty and is actually feeling better today  Objective: Vital signs stable afebrile exam please see pre-assessment evaluation labs and MRCP reviewed  Assessment: CBD stone  Plan: The risks benefits methods and successful rate of ERCP with stone removal was discussed and will proceed today with anesthesia assistance with further workup and plans pending those findings  [facility]

## 2014-03-14 ENCOUNTER — Encounter (HOSPITAL_COMMUNITY): Payer: Self-pay | Admitting: Gastroenterology

## 2014-03-14 LAB — MAGNESIUM: MAGNESIUM: 1.8 mg/dL (ref 1.5–2.5)

## 2014-03-14 LAB — COMPREHENSIVE METABOLIC PANEL
ALBUMIN: 3.2 g/dL — AB (ref 3.5–5.2)
ALT: 67 U/L — ABNORMAL HIGH (ref 0–53)
ANION GAP: 10 (ref 5–15)
AST: 47 U/L — ABNORMAL HIGH (ref 0–37)
Alkaline Phosphatase: 143 U/L — ABNORMAL HIGH (ref 39–117)
BUN: 7 mg/dL (ref 6–23)
CO2: 27 mEq/L (ref 19–32)
Calcium: 8.5 mg/dL (ref 8.4–10.5)
Chloride: 104 mEq/L (ref 96–112)
Creatinine, Ser: 0.86 mg/dL (ref 0.50–1.35)
GFR calc non Af Amer: >90 mL/min (ref >90)
GLUCOSE: 117 mg/dL — AB (ref 70–99)
Potassium: 3.5 mEq/L — ABNORMAL LOW (ref 3.7–5.3)
Sodium: 141 mEq/L (ref 137–147)
TOTAL PROTEIN: 6.3 g/dL (ref 6.0–8.3)
Total Bilirubin: 1.5 mg/dL — ABNORMAL HIGH (ref 0.3–1.2)

## 2014-03-14 LAB — CBC
HEMATOCRIT: 38.9 % — AB (ref 39.0–52.0)
Hemoglobin: 12.7 g/dL — ABNORMAL LOW (ref 13.0–17.0)
MCH: 29.7 pg (ref 26.0–34.0)
MCHC: 32.6 g/dL (ref 30.0–36.0)
MCV: 91.1 fL (ref 78.0–100.0)
Platelets: 142 10*3/uL — ABNORMAL LOW (ref 150–400)
RBC: 4.27 MIL/uL (ref 4.22–5.81)
RDW: 13.4 % (ref 11.5–15.5)
WBC: 4.6 10*3/uL (ref 4.0–10.5)

## 2014-03-14 MED ORDER — ONDANSETRON HCL 4 MG PO TABS: 4.0000 mg | ORAL_TABLET | Freq: Four times a day (QID) | ORAL | Status: AC | PRN

## 2014-03-14 MED ORDER — HYDROCODONE-ACETAMINOPHEN 5-325 MG PO TABS: 1.0000 | ORAL_TABLET | Freq: Four times a day (QID) | ORAL | Status: DC | PRN

## 2014-03-14 MED ORDER — CARVEDILOL 3.125 MG PO TABS: 3.1250 mg | ORAL_TABLET | Freq: Two times a day (BID) | ORAL | Status: AC

## 2014-03-14 MED ORDER — POTASSIUM CHLORIDE CRYS ER 20 MEQ PO TBCR
40.0000 meq | EXTENDED_RELEASE_TABLET | Freq: Once | ORAL | Status: AC
  Administered 2014-03-14: 40 meq via ORAL
  Filled 2014-03-14: qty 2

## 2014-03-14 NOTE — Progress Notes (Signed)
[facility] Progress Note  Subjective: Feels okay today, LFTs improved. Tolerating diet  Objective: Vital signs in last 24 hours: Temp:  [97.9 F (36.6 C)-99.7 F (37.6 C)] 98.7 F (37.1 C) (10/13 0516) Pulse Rate:  [61-84] 65 (10/13 0516) Resp:  [11-18] 18 (10/13 0516) BP: (137-161)/(67-82) 150/77 mmHg (10/13 0516) SpO2:  [93 %-98 %] 95 % (10/13 0516) Weight change:    PE: Abdomen soft  Lab Results: Results for orders placed during the hospital encounter of 03/12/14 (from the past 24 hour(s))  CBC     Status: Abnormal   Collection Time    03/14/14  6:00 AM      Result Value Ref Range   WBC 4.6  4.0 - 10.5 K/uL   RBC 4.27  4.22 - 5.81 MIL/uL   Hemoglobin 12.7 (*) 13.0 - 17.0 g/dL   HCT 38.9 (*) 39.0 - 52.0 %   MCV 91.1  78.0 - 100.0 fL   MCH 29.7  26.0 - 34.0 pg   MCHC 32.6  30.0 - 36.0 g/dL   RDW 13.4  11.5 - 15.5 %   Platelets 142 (*) 150 - 400 K/uL    Studies/Results: Mr 3d Recon At Scanner  03/12/2014   CLINICAL DATA:  Chronic abdominal pain, with acute worsening of symptoms for 18 hr. nausea and vomiting. Pain radiating to back. Suspected choledocholithiasis on CT. Left renal mass.  EXAM: MRI ABDOMEN WITHOUT AND WITH CONTRAST (INCLUDING MRCP)  TECHNIQUE: Multiplanar multisequence MR imaging of the abdomen was performed both before and after the administration of intravenous contrast. Heavily T2-weighted images of the biliary and pancreatic ducts were obtained, and three-dimensional MRCP images were rendered by post processing.  CONTRAST:  66mL MULTIHANCE GADOBENATE DIMEGLUMINE 529 MG/ML IV SOLN  COMPARISON:  CT on 03/12/2014  FINDINGS: Lower chest:  Small hiatal hernia noted.  Hepatobiliary: Diffuse hepatic steatosis noted on chemical shift imaging. A few tiny sub-cm right hepatic lobe cysts are noted, however no liver masses are identified.  Prior cholecystectomy noted. Mild diffuse biliary ductal dilatation is seen with common bile duct measuring  approximately 9 mm and diffuse intrahepatic ductal dilatation also seen. A tiny less than 1 cm stone is seen in the distal common bile duct.  Pancreas: No mass, inflammatory changes, or other parenchymal abnormality identified.  Spleen:  Within normal limits in size and appearance.  Adrenal Glands:  No mass identified.  Kidneys: A large heterogeneously enhancing mass is seen involving the mid and lower pole of the left kidney which measures 9.2 x 12.3 cm, consistent with renal cell carcinoma. A few tiny right renal cysts are noted however no right-sided renal masses are identified.  Stomach/Bowel/Peritoneum: Visualized portions within the abdomen are unremarkable.  Vascular/Lymphatic: No pathologically enlarged lymph nodes identified. No other significant abnormality noted.  Other:  None.  Musculoskeletal:  No suspicious bone lesions identified.  IMPRESSION: Prior cholecystectomy. Mild diffuse biliary ductal dilatation, with small less than 1 cm distal common bile duct stone.  Diffuse hepatic steatosis.  12 cm left renal mass, consistent renal cell carcinoma. No evidence of metastatic disease within the abdomen.  Small hiatal hernia.   Electronically Signed   By: Earle Gell M.D.   On: 03/12/2014 15:05   Dg Ercp Biliary & Pancreatic Ducts  03/13/2014   CLINICAL DATA:  Common bile duct stones  EXAM: ERCP  TECHNIQUE: Multiple spot images obtained with the fluoroscopic device and submitted for interpretation post-procedure.  COMPARISON:  None.  FINDINGS: Images show cannulation of  the common bile duct and contrast filling the biliary tree. Balloon stone retrieval is documented. Final image demonstrates no evidence of filling defect in the common bile duct.  IMPRESSION: See above.  These images were submitted for radiologic interpretation only. Please see the procedural report for the amount of contrast and the fluoroscopy time utilized.   Electronically Signed   By: Maryclare Bean M.D.   On: 03/13/2014 16:11   Mr Jeananne Rama  W/wo Cm/mrcp  03/12/2014   CLINICAL DATA:  Chronic abdominal pain, with acute worsening of symptoms for 18 hr. nausea and vomiting. Pain radiating to back. Suspected choledocholithiasis on CT. Left renal mass.  EXAM: MRI ABDOMEN WITHOUT AND WITH CONTRAST (INCLUDING MRCP)  TECHNIQUE: Multiplanar multisequence MR imaging of the abdomen was performed both before and after the administration of intravenous contrast. Heavily T2-weighted images of the biliary and pancreatic ducts were obtained, and three-dimensional MRCP images were rendered by post processing.  CONTRAST:  20mL MULTIHANCE GADOBENATE DIMEGLUMINE 529 MG/ML IV SOLN  COMPARISON:  CT on 03/12/2014  FINDINGS: Lower chest:  Small hiatal hernia noted.  Hepatobiliary: Diffuse hepatic steatosis noted on chemical shift imaging. A few tiny sub-cm right hepatic lobe cysts are noted, however no liver masses are identified.  Prior cholecystectomy noted. Mild diffuse biliary ductal dilatation is seen with common bile duct measuring approximately 9 mm and diffuse intrahepatic ductal dilatation also seen. A tiny less than 1 cm stone is seen in the distal common bile duct.  Pancreas: No mass, inflammatory changes, or other parenchymal abnormality identified.  Spleen:  Within normal limits in size and appearance.  Adrenal Glands:  No mass identified.  Kidneys: A large heterogeneously enhancing mass is seen involving the mid and lower pole of the left kidney which measures 9.2 x 12.3 cm, consistent with renal cell carcinoma. A few tiny right renal cysts are noted however no right-sided renal masses are identified.  Stomach/Bowel/Peritoneum: Visualized portions within the abdomen are unremarkable.  Vascular/Lymphatic: No pathologically enlarged lymph nodes identified. No other significant abnormality noted.  Other:  None.  Musculoskeletal:  No suspicious bone lesions identified.  IMPRESSION: Prior cholecystectomy. Mild diffuse biliary ductal dilatation, with small less than  1 cm distal common bile duct stone.  Diffuse hepatic steatosis.  12 cm left renal mass, consistent renal cell carcinoma. No evidence of metastatic disease within the abdomen.  Small hiatal hernia.   Electronically Signed   By: Earle Gell M.D.   On: 03/12/2014 15:05      Assessment: Choledocholithiasis, status post ERCP and extraction without complication. Left renal mass suspicious for carcinoma   Plan: Okay for discharge from GI standpoint. Will not need outpatient followup or choledocholithiasis. We'll sign off for now.    Enrica Corliss,Baylen C 03/14/2014, 7:19 AM

## 2014-03-14 NOTE — Care Management Note (Signed)
CARE MANAGEMENT NOTE 03/14/2014  Patient:  Miguel Ingram, Miguel Ingram   Account Number:  [id number]  Date Initiated:  03/14/2014  Documentation initiated by:  Ricki Miller  Subjective/Objective Assessment:   63 yr old male admitted with gallstones, abd pain. 03/13/14 Patient underwent ERCP- New diagnosis of renal ca.     Action/Plan:   Patient given information for followup appointments.Listed on D/C paper work. No further case manager needs.   Anticipated DC Date:  03/14/2014   Anticipated DC Plan:  [place]  CM consult      PAC Choice  NA   Choice offered to / List presented to:     DME arranged  NA        HH arranged  NA      Status of service:  Completed, signed off Medicare Important Message given?   (If response is "NO", the following Medicare IM given date fields will be blank) Date Medicare IM given:   Medicare IM given by:   Date Additional Medicare IM given:   Additional Medicare IM given by:    Discharge Disposition:  HOME/SELF CARE  Per UR Regulation:  Reviewed for med. necessity/level of care/duration of stay

## 2014-03-14 NOTE — Discharge Summary (Signed)
Miguel Ingram, is a 63 y.o. male  DOB [birth year]  MRN [id number].  Admission date:  03/12/2014  Admitting Physician  Thurnell Lose, MD  Discharge Date:  03/14/2014   Primary MD  No primary provider on file.  Recommendations for primary care physician for things to follow:   Check CBC, CMP next visit. 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 Psoriasis     Past Surgical History  Procedure Laterality Date  . Cholecystectomy    . Ercp N/A 03/13/2014    Procedure: ENDOSCOPIC RETROGRADE CHOLANGIOPANCREATOGRAPHY (ERCP);  Surgeon: Jeryl Columbia, MD;  Location: [facility] ENDOSCOPY;  Service: Endoscopy;  Laterality: N/A;       History of present illness and  Hospital Course:     Kindly see H&P for history of present illness and admission details, please review complete Labs, Consult reports and Test reports for all details in brief  HPI  from the history and physical done on the day of admission  Miguel Ingram is a 63 y.o. male with PMH of psoriasis, cholecystectomy and HTN (currently not taking any medications); who presented to [facility] due to abd pain, nausea and vomiting. Pain localized in his RUQ and radiated to epigastric region; associated with nausea and vomiting. Patient denies hematemesis. Pain has been present for 1-2 days PTA and worsening. While in ED he was found to have transaminatis and mild elevation of bilirubin, CT  demonstrated CBD dilatation and potential stone retention. MRCP was recommended and since not available at [facility] patient was transferred to [facility]. Patient is hemodynamically stable otherwise. No fever, chills, normal WBC's, no icterus.    Hospital Course   1. Choledocholithiasis - MRCP noted, ERCP done 03-13-14 with CBD stone removal, symptoms resolved, cleared by GI for discharge with outpatient GI followup.    2. Incidental finding of left renal mass suspicious for renal cell cancer. Case was discussed with urologist Dr. Jeffie Pollock upon admission by the admitting physician, he would like to follow with the patient post discharge within a week. Appointment made.     3. Essential hypertension. We'll place on Coreg upon discharge.   4. GERD. Continue PPI.     5. Mild postprandial hyperglycemia. A1c 5.9. Continue monitoring outpatient. Will discharge on low carb diet.     6. Fatty liver. GI following. Lipid panel stable. Low carb diet with exercise regimen upon discharge. Outpatient GI followup post discharge as well.      Discharge Condition: stable   Follow UP  Follow-up Information   Follow up with [facility] On  03/17/2014. (With Dr. Tresa Moore. 2:45 pm. Evaluation of renal mass)    Contact information:   [address] [place] [address] [phone]      Follow up with [facility], MD. Schedule an appointment as soon as possible for a visit in 1 week.   Specialty:  Gastroenterology   Contact information:   [address] [place] [address] [phone]       Follow up with [facility]    . Schedule an appointment as soon as possible for a visit in 1 week.   Contact information:   [place] [address] [phone]        Discharge Instructions  and  Discharge Medications          Discharge Instructions   Diet - low sodium heart healthy    Complete by:  As  directed      Discharge instructions    Complete by:  As directed   Follow with Primary MD  in 7 days   Get CBC, CMP, 2 view Chest X ray checked  by Primary MD next visit.    Activity: As tolerated with Full fall precautions use walker/cane & assistance as needed   Disposition Home    Diet: Heart Healthy    For Heart failure patients - Check your Weight same time everyday, if you gain over 2 pounds, or you develop in leg swelling, experience more shortness of breath or chest pain, call your Primary MD immediately. Follow Cardiac Low Salt Diet and 1.8 lit/day fluid restriction.   On your next visit with her primary care physician please Get Medicines reviewed and adjusted.  Please request your Prim.MD to go over all Hospital Tests and Procedure/Radiological results at the follow up, please get all Hospital records sent to your Prim MD by signing hospital release before you go home.   If you experience worsening of your admission symptoms, develop shortness of breath, life threatening emergency, suicidal or homicidal thoughts you must seek medical attention immediately by calling 911 or calling your MD immediately  if symptoms less severe.  You Must read complete instructions/literature along with all the possible adverse reactions/side effects for all the Medicines you take and that have been prescribed to you. Take any new Medicines after you have completely understood and accpet all the possible adverse reactions/side effects.   Do not drive, operating heavy machinery, perform activities at heights, swimming or participation in water activities or provide baby sitting services if your were admitted for syncope or siezures until you have seen by Primary MD or a Neurologist and advised to do so again.  Do not drive when taking Pain medications.    Do not take more than prescribed Pain, Sleep and Anxiety Medications  Special Instructions: If you have smoked or chewed Tobacco  in the  last 2 yrs please stop smoking, stop any regular Alcohol  and or any Recreational drug use.  Wear Seat belts while driving.   Please note  You were cared for by a hospitalist during your hospital stay. If you have any questions about your discharge medications or the care you received while you were in the hospital after you are discharged, you can call the unit and asked to speak with the hospitalist on call if the hospitalist that took care of you is not available. Once you are discharged, your primary care physician will handle any further medical issues. Please  note that NO REFILLS for any discharge medications will be authorized once you are discharged, as it is imperative that you return to your primary care physician (or establish a relationship with a primary care physician if you do not have one) for your aftercare needs so that they can reassess your need for medications and monitor your lab values.     Increase activity slowly    Complete by:  As directed             Medication List    STOP taking these medications       ibuprofen 200 MG tablet  Commonly known as:  ADVIL,MOTRIN      TAKE these medications       carvedilol 3.125 MG tablet  Commonly known as:  COREG  Take 1 tablet (3.125 mg total) by mouth 2 (two) times daily with a meal.     HYDROcodone-acetaminophen 5-325 MG per tablet  Commonly known as:  NORCO/VICODIN  Take 1 tablet by mouth every 6 (six) hours as needed for moderate pain.     multivitamin with minerals Tabs tablet  Take 1 tablet by mouth daily.     ondansetron 4 MG tablet  Commonly known as:  ZOFRAN  Take 1 tablet (4 mg total) by mouth every 6 (six) hours as needed for nausea.          Diet and Activity recommendation: See Discharge Instructions above   Consults obtained - GI    Major procedures and Radiology Reports - PLEASE review detailed and final reports for all details, in brief -   ERCP with CBD stone removal - Dr  Watt Climes.    Mr 3d [facility]  03/12/2014   CLINICAL DATA:  Chronic abdominal pain, with acute worsening of symptoms for 18 hr. nausea and vomiting. Pain radiating to back. Suspected choledocholithiasis on CT. Left renal mass.  EXAM: MRI ABDOMEN WITHOUT AND WITH CONTRAST (INCLUDING MRCP)  TECHNIQUE: Multiplanar multisequence MR imaging of the abdomen was performed both before and after the administration of intravenous contrast. Heavily T2-weighted images of the biliary and pancreatic ducts were obtained, and three-dimensional MRCP images were rendered by post processing.  CONTRAST:  68mL MULTIHANCE GADOBENATE DIMEGLUMINE 529 MG/ML IV SOLN  COMPARISON:  CT on 03/12/2014  FINDINGS: Lower chest:  Small hiatal hernia noted.  Hepatobiliary: Diffuse hepatic steatosis noted on chemical shift imaging. A few tiny sub-cm right hepatic lobe cysts are noted, however no liver masses are identified.  Prior cholecystectomy noted. Mild diffuse biliary ductal dilatation is seen with common bile duct measuring approximately 9 mm and diffuse intrahepatic ductal dilatation also seen. A tiny less than 1 cm stone is seen in the distal common bile duct.  Pancreas: No mass, inflammatory changes, or other parenchymal abnormality identified.  Spleen:  Within normal limits in size and appearance.  Adrenal Glands:  No mass identified.  Kidneys: A large heterogeneously enhancing mass is seen involving the mid and lower pole of the left kidney which measures 9.2 x 12.3 cm, consistent with renal cell carcinoma. A few tiny right renal cysts are noted however no right-sided renal masses are identified.  Stomach/Bowel/Peritoneum: Visualized portions within the abdomen are unremarkable.  Vascular/Lymphatic: No pathologically enlarged lymph nodes identified. No other significant abnormality noted.  Other:  None.  Musculoskeletal:  No suspicious bone lesions identified.  IMPRESSION: Prior cholecystectomy. Mild diffuse biliary ductal  dilatation, with small less than 1 cm distal common bile duct stone.  Diffuse hepatic steatosis.  12 cm left renal mass, consistent renal cell carcinoma. No evidence of metastatic disease within the abdomen.  Small hiatal hernia.   Electronically Signed   By: Earle Gell M.D.   On: 03/12/2014 15:05   Dg Ercp Biliary & Pancreatic Ducts  03/13/2014   CLINICAL DATA:  Common bile duct stones  EXAM: ERCP  TECHNIQUE: Multiple spot images obtained with the fluoroscopic device and submitted for interpretation post-procedure.  COMPARISON:  None.  FINDINGS: Images show cannulation of the common bile duct and contrast filling the biliary tree. Balloon stone retrieval is documented. Final image demonstrates no evidence of filling defect in the common bile duct.  IMPRESSION: See above.  These images were submitted for radiologic interpretation only. Please see the procedural report for the amount of contrast and the fluoroscopy time utilized.   Electronically Signed   By: Maryclare Bean M.D.   On: 03/13/2014 16:11   Mr Jeananne Rama W/wo Cm/mrcp  03/12/2014   CLINICAL DATA:  Chronic abdominal pain, with acute worsening of symptoms for 18 hr. nausea and vomiting. Pain radiating to back. Suspected choledocholithiasis on CT. Left renal mass.  EXAM: MRI ABDOMEN WITHOUT AND WITH CONTRAST (INCLUDING MRCP)  TECHNIQUE: Multiplanar multisequence MR imaging of the abdomen was performed both before and after the administration of intravenous contrast. Heavily T2-weighted images of the biliary and pancreatic ducts were obtained, and three-dimensional MRCP images were rendered by post processing.  CONTRAST:  11mL MULTIHANCE GADOBENATE DIMEGLUMINE 529 MG/ML IV SOLN  COMPARISON:  CT on 03/12/2014  FINDINGS: Lower chest:  Small hiatal hernia noted.  Hepatobiliary: Diffuse hepatic steatosis noted on chemical shift imaging. A few tiny sub-cm right hepatic lobe cysts are noted, however no liver masses are identified.  Prior cholecystectomy noted. Mild  diffuse biliary ductal dilatation is seen with common bile duct measuring approximately 9 mm and diffuse intrahepatic ductal dilatation also seen. A tiny less than 1 cm stone is seen in the distal common bile duct.  Pancreas: No mass, inflammatory changes, or other parenchymal abnormality identified.  Spleen:  Within normal limits in size and appearance.  Adrenal Glands:  No mass identified.  Kidneys: A large heterogeneously enhancing mass is seen involving the mid and lower pole of the left kidney which measures 9.2 x 12.3 cm, consistent with renal cell carcinoma. A few tiny right renal cysts are noted however no right-sided renal masses are identified.  Stomach/Bowel/Peritoneum: Visualized portions within the abdomen are unremarkable.  Vascular/Lymphatic: No pathologically enlarged lymph nodes identified. No other significant abnormality noted.  Other:  None.  Musculoskeletal:  No suspicious bone lesions identified.  IMPRESSION: Prior cholecystectomy. Mild diffuse biliary ductal dilatation, with small less than 1 cm distal common bile duct stone.  Diffuse hepatic steatosis.  12 cm left renal mass, consistent renal cell carcinoma. No evidence of metastatic disease within the abdomen.  Small hiatal hernia.   Electronically Signed   By: Earle Gell M.D.   On: 03/12/2014 15:05    Micro Results      No results found for this or any previous visit (from the past 240 hour(s)).     Today   Subjective:   Otilio Jefferson today has no headache,no chest abdominal pain,no new weakness tingling or numbness, feels much better wants to go home today.    Objective:   Blood pressure 150/77, pulse 65, temperature 98.7 F (37.1 C), temperature source Oral, resp. rate 18, height 6' (1.829 m), weight 97.523 kg (215 lb), SpO2 95.00%.   Intake/Output Summary (  Last 24 hours) at 03/14/14 1005 Last data filed at 03/14/14 0800  Gross per 24 hour  Intake   7590 ml  Output      0 ml  Net   7590 ml    Exam Awake  Alert, Oriented x 3, No new F.N deficits, Normal affect [place].AT,PERRAL Supple Neck,No JVD, No cervical lymphadenopathy appriciated.  Symmetrical Chest wall movement, Good air movement bilaterally, CTAB RRR,No Gallops,Rubs or new Murmurs, No Parasternal Heave +ve B.Sounds, Abd Soft, Non tender, No organomegaly appriciated, No rebound -guarding or rigidity. No Cyanosis, Clubbing or edema, No new Rash or bruise  Data Review   CBC w Diff: Lab Results  Component Value Date   WBC 4.6 03/14/2014   HGB 12.7* 03/14/2014   HCT 38.9* 03/14/2014   PLT 142* 03/14/2014   LYMPHOPCT 14 03/12/2014   MONOPCT 6 03/12/2014   EOSPCT 0 03/12/2014   BASOPCT 0 03/12/2014    CMP: Lab Results  Component Value Date   NA 141 03/14/2014   K 3.5* 03/14/2014   CL 104 03/14/2014   CO2 27 03/14/2014   BUN 7 03/14/2014   CREATININE 0.86 03/14/2014   PROT 6.3 03/14/2014   ALBUMIN 3.2* 03/14/2014   BILITOT 1.5* 03/14/2014   ALKPHOS 143* 03/14/2014   AST 47* 03/14/2014   ALT 67* 03/14/2014  .   Total Time in preparing paper work, data evaluation and todays exam - 35 minutes  Thurnell Lose M.D on 03/14/2014 at 10:05 AM  [facility] Office  [phone]

## 2014-03-14 NOTE — Discharge Instructions (Signed)
Follow with Primary MD  in 7 days   Get CBC, CMP, 2 view Chest X ray checked  by Primary MD next visit.    Activity: As tolerated with Full fall precautions use walker/cane & assistance as needed   Disposition Home    Diet: Heart Healthy    For Heart failure patients - Check your Weight same time everyday, if you gain over 2 pounds, or you develop in leg swelling, experience more shortness of breath or chest pain, call your Primary MD immediately. Follow Cardiac Low Salt Diet and 1.8 lit/day fluid restriction.   On your next visit with her primary care physician please Get Medicines reviewed and adjusted.  Please request your Prim.MD to go over all Hospital Tests and Procedure/Radiological results at the follow up, please get all Hospital records sent to your Prim MD by signing hospital release before you go home.   If you experience worsening of your admission symptoms, develop shortness of breath, life threatening emergency, suicidal or homicidal thoughts you must seek medical attention immediately by calling 911 or calling your MD immediately  if symptoms less severe.  You Must read complete instructions/literature along with all the possible adverse reactions/side effects for all the Medicines you take and that have been prescribed to you. Take any new Medicines after you have completely understood and accpet all the possible adverse reactions/side effects.   Do not drive, operating heavy machinery, perform activities at heights, swimming or participation in water activities or provide baby sitting services if your were admitted for syncope or siezures until you have seen by Primary MD or a Neurologist and advised to do so again.  Do not drive when taking Pain medications.    Do not take more than prescribed Pain, Sleep and Anxiety Medications  Special Instructions: If you have smoked or chewed Tobacco  in the last 2 yrs please stop smoking, stop any regular Alcohol  and or any  Recreational drug use.  Wear Seat belts while driving.   Please note  You were cared for by a hospitalist during your hospital stay. If you have any questions about your discharge medications or the care you received while you were in the hospital after you are discharged, you can call the unit and asked to speak with the hospitalist on call if the hospitalist that took care of you is not available. Once you are discharged, your primary care physician will handle any further medical issues. Please note that NO REFILLS for any discharge medications will be authorized once you are discharged, as it is imperative that you return to your primary care physician (or establish a relationship with a primary care physician if you do not have one) for your aftercare needs so that they can reassess your need for medications and monitor your lab values.

## 2014-03-28 ENCOUNTER — Other Ambulatory Visit: Payer: Self-pay | Admitting: Urology

## 2014-05-04 NOTE — Patient Instructions (Addendum)
Miguel Ingram  05/04/2014   Your procedure is scheduled on: 05/12/2014     Come thru the Emergency Room entrance.   Follow the Signs to [place] at   0530     am  Call this number if you have problems the morning of surgery: [phone]   Remember:   Do not eat food or drink liquids after midnight.   Take these medicines the morning of surgery with A SIP OF WATER: none    Do not wear jewelry,   Do not wear lotions, powders, or perfumes.  deodorant.   Men may shave face and neck.  Do not bring valuables to the hospital.  Contacts, dentures or bridgework may not be worn into surgery.  Leave suitcase in the car. After surgery it may be brought to your room.  For patients admitted to the hospital, checkout time is 11:00 AM the day of  discharge.         Please read over the following fact sheets that you were given: WHAT IS A BLOOD TRANSFUSION? Blood Transfusion Information  A transfusion is the replacement of blood or some of its parts. Blood is made up of multiple cells which provide different functions.  Red blood cells carry oxygen and are used for blood loss replacement.  White blood cells fight against infection.  Platelets control bleeding.  Plasma helps clot blood.  Other blood products are available for specialized needs, such as hemophilia or other clotting disorders. BEFORE THE TRANSFUSION  Who gives blood for transfusions?   Healthy volunteers who are fully evaluated to make sure their blood is safe. This is blood bank blood. Transfusion therapy is the safest it has ever been in the practice of medicine. Before blood is taken from a donor, a complete history is taken to make sure that person has no history of diseases nor engages in risky social behavior (examples are intravenous drug use or sexual activity with multiple partners). The donor's travel history is screened to minimize risk of transmitting infections, such as malaria. The donated blood is tested for  signs of infectious diseases, such as HIV and hepatitis. The blood is then tested to be sure it is compatible with you in order to minimize the chance of a transfusion reaction. If you or a relative donates blood, this is often done in anticipation of surgery and is not appropriate for emergency situations. It takes many days to process the donated blood. RISKS AND COMPLICATIONS Although transfusion therapy is very safe and saves many lives, the main dangers of transfusion include:   Getting an infectious disease.  Developing a transfusion reaction. This is an allergic reaction to something in the blood you were given. Every precaution is taken to prevent this. The decision to have a blood transfusion has been considered carefully by your caregiver before blood is given. Blood is not given unless the benefits outweigh the risks. AFTER THE TRANSFUSION  Right after receiving a blood transfusion, you will usually feel much better and more energetic. This is especially true if your red blood cells have gotten low (anemic). The transfusion raises the level of the red blood cells which carry oxygen, and this usually causes an energy increase.  The nurse administering the transfusion will monitor you carefully for complications. HOME CARE INSTRUCTIONS  No special instructions are needed after a transfusion. You may find your energy is better. Speak with your caregiver about any limitations on activity for underlying diseases you may have. SEEK  MEDICAL CARE IF:   Your condition is not improving after your transfusion.  You develop redness or irritation at the intravenous (IV) site. SEEK IMMEDIATE MEDICAL CARE IF:  Any of the following symptoms occur over the next 12 hours:  Shaking chills.  You have a temperature by mouth above 102 F (38.9 C), not controlled by medicine.  Chest, back, or muscle pain.  People around you feel you are not acting correctly or are confused.  Shortness of breath  or difficulty breathing.  Dizziness and fainting.  You get a rash or develop hives.  You have a decrease in urine output.  Your urine turns a dark color or changes to pink, red, or brown. Any of the following symptoms occur over the next 10 days:  You have a temperature by mouth above 102 F (38.9 C), not controlled by medicine.  Shortness of breath.  Weakness after normal activity.  The white part of the eye turns yellow (jaundice).  You have a decrease in the amount of urine or are urinating less often.  Your urine turns a dark color or changes to pink, red, or brown. Document Released: 05/16/2000 Document Revised: 08/11/2011 Document Reviewed: 01/03/2008 ExitCare Patient Information 2014 ExitCare, [place].  _______________________________________________________________________ coughing and deep breathing exercises, leg exercises            [facility] - Preparing for Surgery Before surgery, you can play an important role.  Because skin is not sterile, your skin needs to be as free of germs as possible.  You can reduce the number of germs on your skin by washing with CHG (chlorahexidine gluconate) soap before surgery.  CHG is an antiseptic cleaner which kills germs and bonds with the skin to continue killing germs even after washing. Please DO NOT use if you have an allergy to CHG or antibacterial soaps.  If your skin becomes reddened/irritated stop using the CHG and inform your nurse when you arrive at Short Stay. Do not shave (including legs and underarms) for at least 48 hours prior to the first CHG shower.  You may shave your face/neck. Please follow these instructions carefully:  1.  Shower with CHG Soap the night before surgery and the  morning of Surgery.  2.  If you choose to wash your hair, wash your hair first as usual with your  normal  shampoo.  3.  After you shampoo, rinse your hair and body thoroughly to remove the  shampoo.                           4.  Use CHG as  you would any other liquid soap.  You can apply chg directly  to the skin and wash                       Gently with a scrungie or clean washcloth.  5.  Apply the CHG Soap to your body ONLY FROM THE NECK DOWN.   Do not use on face/ open                           Wound or open sores. Avoid contact with eyes, ears mouth and genitals (private parts).                       Wash face,  Genitals (private parts) with your normal soap.  6.  Wash thoroughly, paying special attention to the area where your surgery  will be performed.  7.  Thoroughly rinse your body with warm water from the neck down.  8.  DO NOT shower/wash with your normal soap after using and rinsing off  the CHG Soap.                9.  Pat yourself dry with a clean towel.            10.  Wear clean pajamas.            11.  Place clean sheets on your bed the night of your first shower and do not  sleep with pets. Day of Surgery : Do not apply any lotions/deodorants the morning of surgery.  Please wear clean clothes to the hospital/surgery center.  FAILURE TO FOLLOW THESE INSTRUCTIONS MAY RESULT IN THE CANCELLATION OF YOUR SURGERY PATIENT SIGNATURE_________________________________  NURSE SIGNATURE__________________________________  ________________________________________________________________________

## 2014-05-05 ENCOUNTER — Encounter (HOSPITAL_COMMUNITY)
Admission: RE | Admit: 2014-05-05 | Discharge: 2014-05-05 | Disposition: A | Discharge status: Home / Self Care | Payer: PRIVATE HEALTH INSURANCE | Source: Ambulatory Visit | Attending: Urology | Admitting: Urology

## 2014-05-05 ENCOUNTER — Encounter (HOSPITAL_COMMUNITY): Payer: Self-pay

## 2014-05-05 DIAGNOSIS — I10 Essential (primary) hypertension: Secondary | ICD-10-CM | POA: Diagnosis not present

## 2014-05-05 DIAGNOSIS — Z01812 Encounter for preprocedural laboratory examination: Secondary | ICD-10-CM | POA: Insufficient documentation

## 2014-05-05 HISTORY — DX: Gastro-esophageal reflux disease without esophagitis: K21.9

## 2014-05-05 HISTORY — DX: Pneumonia, unspecified organism: J18.9

## 2014-05-05 HISTORY — DX: Unspecified osteoarthritis, unspecified site: M19.90

## 2014-05-05 HISTORY — DX: Inflammatory liver disease, unspecified: K75.9

## 2014-05-05 HISTORY — DX: Essential (primary) hypertension: I10

## 2014-05-05 LAB — BASIC METABOLIC PANEL
ANION GAP: 14 (ref 5–15)
BUN: 11 mg/dL (ref 6–23)
CO2: 25 mEq/L (ref 19–32)
Calcium: 9.6 mg/dL (ref 8.4–10.5)
Chloride: 101 mEq/L (ref 96–112)
Creatinine, Ser: 0.87 mg/dL (ref 0.50–1.35)
GFR calc Af Amer: >90 mL/min (ref >90)
GFR calc non Af Amer: 89 mL/min — ABNORMAL LOW (ref >90)
GLUCOSE: 161 mg/dL — AB (ref 70–99)
POTASSIUM: 4.1 meq/L (ref 3.7–5.3)
Sodium: 140 mEq/L (ref 137–147)

## 2014-05-05 LAB — CBC
HCT: 41.7 % (ref 39.0–52.0)
HEMOGLOBIN: 13.7 g/dL (ref 13.0–17.0)
MCH: 28.9 pg (ref 26.0–34.0)
MCHC: 32.9 g/dL (ref 30.0–36.0)
MCV: 88 fL (ref 78.0–100.0)
PLATELETS: 183 10*3/uL (ref 150–400)
RBC: 4.74 MIL/uL (ref 4.22–5.81)
RDW: 12.7 % (ref 11.5–15.5)
WBC: 5.2 10*3/uL (ref 4.0–10.5)

## 2014-05-05 NOTE — Progress Notes (Signed)
Patient reported at time of preop appointment on no blood pressure medications and no history of any heart problems.  Found in note 03/13/14 in EPIC - patient was placed on Coreg for hypertension and to followup with PCP.  Patient does not report having PCP.  Will need to obtain EKG and CXR am of surgery.

## 2014-05-10 NOTE — Progress Notes (Signed)
Spoke with patient over phone again and patient states he has no PCP.  In looking over discharge summary from 03/2014 when in hospital patient was supposed to have followup with a PCP.  Patient is no longer on blood pressure medication he was discharged on 03/2014 .  Called and left a message with Noemi Chapel at [facility] regarding this.

## 2014-05-11 ENCOUNTER — Encounter (HOSPITAL_COMMUNITY): Payer: Self-pay | Admitting: Anesthesiology

## 2014-05-11 NOTE — Anesthesia Preprocedure Evaluation (Signed)
Anesthesia Evaluation  Patient identified by MRN, date of birth, ID band Patient awake    Reviewed: Allergy & Precautions, NPO status , Patient's Chart, lab work & pertinent test results  Airway Mallampati: II  TM Distance: >3 FB Neck ROM: Full    Dental no notable dental hx.    Pulmonary pneumonia -, resolved, former smoker,  breath sounds clear to auscultation  Pulmonary exam normal       Cardiovascular Exercise Tolerance: Good hypertension, Pt. on home beta blockers and Pt. on medications Rhythm:Regular Rate:Normal     Neuro/Psych negative neurological ROS  negative psych ROS   GI/Hepatic GERD-  Medicated,(+) Hepatitis -  Endo/Other  negative endocrine ROS  Renal/GU negative Renal ROS  negative genitourinary   Musculoskeletal  (+) Arthritis -,   Abdominal   Peds negative pediatric ROS (+)  Hematology negative hematology ROS (+)   Anesthesia Other Findings   Reproductive/Obstetrics negative OB ROS                             Anesthesia Physical Anesthesia Plan  ASA: II  Anesthesia Plan: General   Post-op Pain Management:    Induction: Intravenous  Airway Management Planned: Oral ETT  Additional Equipment:   Intra-op Plan:   Post-operative Plan: Extubation in OR  Informed Consent: I have reviewed the patients History and Physical, chart, labs and discussed the procedure including the risks, benefits and alternatives for the proposed anesthesia with the patient or authorized representative who has indicated his/her understanding and acceptance.   Dental advisory given  Plan Discussed with: CRNA  Anesthesia Plan Comments:         Anesthesia Quick Evaluation

## 2014-05-12 ENCOUNTER — Inpatient Hospital Stay (HOSPITAL_COMMUNITY): Payer: PRIVATE HEALTH INSURANCE

## 2014-05-12 ENCOUNTER — Inpatient Hospital Stay (HOSPITAL_COMMUNITY): Payer: PRIVATE HEALTH INSURANCE | Admitting: Anesthesiology

## 2014-05-12 ENCOUNTER — Encounter (HOSPITAL_COMMUNITY): Payer: Self-pay | Admitting: *Deleted

## 2014-05-12 ENCOUNTER — Inpatient Hospital Stay (HOSPITAL_COMMUNITY)
Admission: RE | Admit: 2014-05-12 | Discharge: 2014-05-14 | DRG: 658 | Disposition: A | Discharge status: Home / Self Care | Payer: PRIVATE HEALTH INSURANCE | Source: Ambulatory Visit | Attending: Urology | Admitting: Urology

## 2014-05-12 ENCOUNTER — Encounter (HOSPITAL_COMMUNITY)
Admission: RE | Disposition: A | Discharge status: Home / Self Care | Payer: Self-pay | Source: Ambulatory Visit | Attending: Urology

## 2014-05-12 DIAGNOSIS — Z01812 Encounter for preprocedural laboratory examination: Secondary | ICD-10-CM | POA: Diagnosis not present

## 2014-05-12 DIAGNOSIS — K219 Gastro-esophageal reflux disease without esophagitis: Secondary | ICD-10-CM | POA: Diagnosis present

## 2014-05-12 DIAGNOSIS — N2889 Other specified disorders of kidney and ureter: Secondary | ICD-10-CM | POA: Diagnosis present

## 2014-05-12 DIAGNOSIS — Z8619 Personal history of other infectious and parasitic diseases: Secondary | ICD-10-CM | POA: Diagnosis not present

## 2014-05-12 DIAGNOSIS — C642 Malignant neoplasm of left kidney, except renal pelvis: Secondary | ICD-10-CM | POA: Diagnosis present

## 2014-05-12 DIAGNOSIS — I10 Essential (primary) hypertension: Secondary | ICD-10-CM

## 2014-05-12 DIAGNOSIS — Z87891 Personal history of nicotine dependence: Secondary | ICD-10-CM

## 2014-05-12 DIAGNOSIS — I151 Hypertension secondary to other renal disorders: Secondary | ICD-10-CM

## 2014-05-12 DIAGNOSIS — Z9049 Acquired absence of other specified parts of digestive tract: Secondary | ICD-10-CM | POA: Diagnosis present

## 2014-05-12 DIAGNOSIS — R339 Retention of urine, unspecified: Secondary | ICD-10-CM | POA: Diagnosis not present

## 2014-05-12 HISTORY — PX: ROBOT ASSISTED LAPAROSCOPIC NEPHRECTOMY: SHX5140

## 2014-05-12 LAB — HEMOGLOBIN AND HEMATOCRIT, BLOOD
HEMATOCRIT: 41.9 % (ref 39.0–52.0)
HEMOGLOBIN: 14 g/dL (ref 13.0–17.0)

## 2014-05-12 LAB — ABO/RH: ABO/RH(D): A POS

## 2014-05-12 LAB — TYPE AND SCREEN
ABO/RH(D): A POS
ANTIBODY SCREEN: NEGATIVE

## 2014-05-12 SURGERY — ROBOTIC ASSISTED LAPAROSCOPIC NEPHRECTOMY
Anesthesia: General | Laterality: Left

## 2014-05-12 MED ORDER — PROPOFOL 10 MG/ML IV BOLUS
INTRAVENOUS | Status: DC | PRN
  Administered 2014-05-12: 200 mg via INTRAVENOUS

## 2014-05-12 MED ORDER — MIDAZOLAM HCL 2 MG/2ML IJ SOLN
INTRAMUSCULAR | Status: AC
  Filled 2014-05-12: qty 2

## 2014-05-12 MED ORDER — ROCURONIUM BROMIDE 100 MG/10ML IV SOLN
INTRAVENOUS | Status: AC
  Filled 2014-05-12: qty 1

## 2014-05-12 MED ORDER — FENTANYL CITRATE 0.05 MG/ML IJ SOLN
INTRAMUSCULAR | Status: DC | PRN
  Administered 2014-05-12 (×5): 50 ug via INTRAVENOUS

## 2014-05-12 MED ORDER — LACTATED RINGERS IV SOLN: INTRAVENOUS | Status: DC

## 2014-05-12 MED ORDER — NEOSTIGMINE METHYLSULFATE 10 MG/10ML IV SOLN
INTRAVENOUS | Status: DC | PRN
  Administered 2014-05-12: 5 mg via INTRAVENOUS

## 2014-05-12 MED ORDER — CEFAZOLIN SODIUM-DEXTROSE 2-3 GM-% IV SOLR
INTRAVENOUS | Status: AC
  Filled 2014-05-12: qty 50

## 2014-05-12 MED ORDER — EPHEDRINE SULFATE 50 MG/ML IJ SOLN
INTRAMUSCULAR | Status: DC | PRN
  Administered 2014-05-12: 5 mg via INTRAVENOUS
  Administered 2014-05-12 (×2): 10 mg via INTRAVENOUS
  Administered 2014-05-12 (×2): 5 mg via INTRAVENOUS
  Administered 2014-05-12: 10 mg via INTRAVENOUS

## 2014-05-12 MED ORDER — OXYCODONE HCL 5 MG PO TABS
5.0000 mg | ORAL_TABLET | ORAL | Status: DC | PRN
  Administered 2014-05-13 – 2014-05-14 (×3): 5 mg via ORAL
  Filled 2014-05-12 (×3): qty 1

## 2014-05-12 MED ORDER — EPHEDRINE SULFATE 50 MG/ML IJ SOLN
INTRAMUSCULAR | Status: AC
  Filled 2014-05-12: qty 2

## 2014-05-12 MED ORDER — GLYCOPYRROLATE 0.2 MG/ML IJ SOLN
INTRAMUSCULAR | Status: AC
  Filled 2014-05-12: qty 4

## 2014-05-12 MED ORDER — ROCURONIUM BROMIDE 100 MG/10ML IV SOLN
INTRAVENOUS | Status: DC | PRN
  Administered 2014-05-12 (×3): 20 mg via INTRAVENOUS
  Administered 2014-05-12: 30 mg via INTRAVENOUS

## 2014-05-12 MED ORDER — DEXAMETHASONE SODIUM PHOSPHATE 10 MG/ML IJ SOLN
INTRAMUSCULAR | Status: AC
  Filled 2014-05-12: qty 1

## 2014-05-12 MED ORDER — PROPOFOL 10 MG/ML IV BOLUS
INTRAVENOUS | Status: AC
  Filled 2014-05-12: qty 20

## 2014-05-12 MED ORDER — DEXAMETHASONE SODIUM PHOSPHATE 10 MG/ML IJ SOLN
INTRAMUSCULAR | Status: DC | PRN
  Administered 2014-05-12: 10 mg via INTRAVENOUS

## 2014-05-12 MED ORDER — HYDROMORPHONE HCL 1 MG/ML IJ SOLN: 0.2500 mg | INTRAMUSCULAR | Status: DC | PRN

## 2014-05-12 MED ORDER — SODIUM CHLORIDE 0.9 % IJ SOLN
INTRAMUSCULAR | Status: AC
  Filled 2014-05-12: qty 30

## 2014-05-12 MED ORDER — BUPIVACAINE LIPOSOME 1.3 % IJ SUSP
20.0000 mL | Freq: Once | INTRAMUSCULAR | Status: AC
  Administered 2014-05-12: 20 mL
  Filled 2014-05-12: qty 20

## 2014-05-12 MED ORDER — LIDOCAINE HCL (CARDIAC) 20 MG/ML IV SOLN
INTRAVENOUS | Status: DC | PRN
  Administered 2014-05-12: 50 mg via INTRAVENOUS

## 2014-05-12 MED ORDER — GLYCOPYRROLATE 0.2 MG/ML IJ SOLN
INTRAMUSCULAR | Status: DC | PRN
  Administered 2014-05-12: .8 mg via INTRAVENOUS

## 2014-05-12 MED ORDER — PROMETHAZINE HCL 25 MG/ML IJ SOLN
6.2500 mg | INTRAMUSCULAR | Status: DC | PRN
Start: 2014-05-12 — End: 2014-05-12

## 2014-05-12 MED ORDER — HYDROMORPHONE HCL 1 MG/ML IJ SOLN
0.5000 mg | INTRAMUSCULAR | Status: DC | PRN
  Administered 2014-05-12 – 2014-05-14 (×2): 1 mg via INTRAVENOUS
  Filled 2014-05-12 (×2): qty 1

## 2014-05-12 MED ORDER — ONDANSETRON HCL 4 MG/2ML IJ SOLN
4.0000 mg | INTRAMUSCULAR | Status: DC | PRN
  Administered 2014-05-12 – 2014-05-14 (×4): 4 mg via INTRAVENOUS
  Filled 2014-05-12 (×4): qty 2

## 2014-05-12 MED ORDER — NEOSTIGMINE METHYLSULFATE 10 MG/10ML IV SOLN
INTRAVENOUS | Status: AC
  Filled 2014-05-12: qty 1

## 2014-05-12 MED ORDER — ACETAMINOPHEN 500 MG PO TABS
1000.0000 mg | ORAL_TABLET | Freq: Four times a day (QID) | ORAL | Status: AC
  Administered 2014-05-12 – 2014-05-13 (×4): 1000 mg via ORAL
  Filled 2014-05-12 (×5): qty 2

## 2014-05-12 MED ORDER — CARVEDILOL 3.125 MG PO TABS: 3.1250 mg | ORAL_TABLET | Freq: Two times a day (BID) | ORAL | Status: DC

## 2014-05-12 MED ORDER — MIDAZOLAM HCL 5 MG/5ML IJ SOLN
INTRAMUSCULAR | Status: DC | PRN
  Administered 2014-05-12: 2 mg via INTRAVENOUS

## 2014-05-12 MED ORDER — CEFAZOLIN SODIUM-DEXTROSE 2-3 GM-% IV SOLR
2.0000 g | INTRAVENOUS | Status: AC
  Administered 2014-05-12 (×2): 2 g via INTRAVENOUS

## 2014-05-12 MED ORDER — DEXTROSE-NACL 5-0.45 % IV SOLN
INTRAVENOUS | Status: DC
  Administered 2014-05-12 – 2014-05-13 (×3): via INTRAVENOUS

## 2014-05-12 MED ORDER — SUCCINYLCHOLINE CHLORIDE 20 MG/ML IJ SOLN
INTRAMUSCULAR | Status: DC | PRN
  Administered 2014-05-12: 100 mg via INTRAVENOUS
  Administered 2014-05-12: 30 mg via INTRAVENOUS

## 2014-05-12 MED ORDER — ONDANSETRON HCL 4 MG/2ML IJ SOLN
INTRAMUSCULAR | Status: AC
  Filled 2014-05-12: qty 2

## 2014-05-12 MED ORDER — SENNOSIDES-DOCUSATE SODIUM 8.6-50 MG PO TABS
1.0000 | ORAL_TABLET | Freq: Two times a day (BID) | ORAL | Status: DC
  Administered 2014-05-12 – 2014-05-14 (×4): 1 via ORAL
  Filled 2014-05-12 (×4): qty 1

## 2014-05-12 MED ORDER — LACTATED RINGERS IV SOLN
INTRAVENOUS | Status: DC | PRN
  Administered 2014-05-12 (×2): via INTRAVENOUS

## 2014-05-12 MED ORDER — HYDROMORPHONE HCL 2 MG/ML IJ SOLN
INTRAMUSCULAR | Status: AC
  Filled 2014-05-12: qty 1

## 2014-05-12 MED ORDER — CARVEDILOL 3.125 MG PO TABS
3.1250 mg | ORAL_TABLET | Freq: Two times a day (BID) | ORAL | Status: DC
  Administered 2014-05-12 – 2014-05-14 (×4): 3.125 mg via ORAL
  Filled 2014-05-12 (×4): qty 1

## 2014-05-12 MED ORDER — FENTANYL CITRATE 0.05 MG/ML IJ SOLN
INTRAMUSCULAR | Status: AC
  Filled 2014-05-12: qty 5

## 2014-05-12 MED ORDER — LIDOCAINE HCL (CARDIAC) 20 MG/ML IV SOLN
INTRAVENOUS | Status: AC
  Filled 2014-05-12: qty 5

## 2014-05-12 MED ORDER — ONDANSETRON HCL 4 MG/2ML IJ SOLN
INTRAMUSCULAR | Status: DC | PRN
  Administered 2014-05-12: 4 mg via INTRAVENOUS

## 2014-05-12 SURGICAL SUPPLY — 50 items
CHLORAPREP W/TINT 26ML (MISCELLANEOUS) ×3 IMPLANT
CLIP LIGATING HEM O LOK PURPLE (MISCELLANEOUS) ×6 IMPLANT
CLIP LIGATING HEMO LOK XL GOLD (MISCELLANEOUS) ×3 IMPLANT
CLIP LIGATING HEMO O LOK GREEN (MISCELLANEOUS) ×3 IMPLANT
CORDS BIPOLAR (ELECTRODE) ×3 IMPLANT
COVER SURGICAL LIGHT HANDLE (MISCELLANEOUS) ×3 IMPLANT
COVER TIP SHEARS 8 DVNC (MISCELLANEOUS) ×1 IMPLANT
COVER TIP SHEARS 8MM DA VINCI (MISCELLANEOUS) ×2
CUTTER ECHEON FLEX ENDO 45 340 (ENDOMECHANICALS) ×3 IMPLANT
DECANTER SPIKE VIAL GLASS SM (MISCELLANEOUS) ×3 IMPLANT
DRAIN CHANNEL 15F RND FF 3/16 (WOUND CARE) IMPLANT
DRAPE INCISE IOBAN 66X45 STRL (DRAPES) ×3 IMPLANT
DRAPE LAPAROSCOPIC ABDOMINAL (DRAPES) ×3 IMPLANT
DRAPE SHEET LG 3/4 BI-LAMINATE (DRAPES) ×6 IMPLANT
DRAPE TABLE BACK 44X90 PK DISP (DRAPES) ×3 IMPLANT
DRAPE WARM FLUID 44X44 (DRAPE) ×3 IMPLANT
ELECT REM PT RETURN 9FT ADLT (ELECTROSURGICAL) ×3
ELECTRODE REM PT RTRN 9FT ADLT (ELECTROSURGICAL) ×1 IMPLANT
EVACUATOR SILICONE 100CC (DRAIN) IMPLANT
GLOVE BIOGEL M STRL SZ7.5 (GLOVE) ×6 IMPLANT
GOWN STRL REUS W/TWL LRG LVL3 (GOWN DISPOSABLE) ×6 IMPLANT
KIT ACCESSORY DA VINCI DISP (KITS) ×2
KIT ACCESSORY DVNC DISP (KITS) ×1 IMPLANT
KIT BASIN OR (CUSTOM PROCEDURE TRAY) ×3 IMPLANT
LIQUID BAND (GAUZE/BANDAGES/DRESSINGS) ×6 IMPLANT
NEEDLE INSUFFLATION 14GA 120MM (NEEDLE) ×3 IMPLANT
PENCIL BUTTON HOLSTER BLD 10FT (ELECTRODE) ×3 IMPLANT
POSITIONER SURGICAL ARM (MISCELLANEOUS) ×6 IMPLANT
POUCH ENDO CATCH II 15MM (MISCELLANEOUS) ×3 IMPLANT
RELOAD GREEN ECHELON 45 (STAPLE) ×18 IMPLANT
RELOAD WH ECHELON 45 (STAPLE) ×3 IMPLANT
SET TUBE IRRIG SUCTION NO TIP (IRRIGATION / IRRIGATOR) ×3 IMPLANT
SOLUTION ANTI FOG 6CC (MISCELLANEOUS) ×3 IMPLANT
SOLUTION ELECTROLUBE (MISCELLANEOUS) ×3 IMPLANT
SPONGE LAP 18X18 X RAY DECT (DISPOSABLE) ×3 IMPLANT
SPONGE LAP 4X18 X RAY DECT (DISPOSABLE) ×3 IMPLANT
SUT ETHILON 3 0 PS 1 (SUTURE) ×3 IMPLANT
SUT MNCRL AB 4-0 PS2 18 (SUTURE) ×12 IMPLANT
SUT PDS AB 1 CT1 27 (SUTURE) ×6 IMPLANT
SUT PROLENE 2 0 SH DA (SUTURE) ×3 IMPLANT
SUT VICRYL 0 UR6 27IN ABS (SUTURE) ×6 IMPLANT
SYR BULB IRRIGATION 50ML (SYRINGE) IMPLANT
TOWEL OR NON WOVEN STRL DISP B (DISPOSABLE) ×6 IMPLANT
TRAY FOLEY CATH 14FRSI W/METER (CATHETERS) ×3 IMPLANT
TRAY LAPAROSCOPIC (CUSTOM PROCEDURE TRAY) ×3 IMPLANT
TROCAR 12M 150ML BLUNT (TROCAR) ×6 IMPLANT
TROCAR UNIVERSAL OPT 12M 100M (ENDOMECHANICALS) ×3 IMPLANT
TROCAR XCEL 12X100 BLDLESS (ENDOMECHANICALS) ×6 IMPLANT
TUBING INSUFFLATION 10FT LAP (TUBING) ×3 IMPLANT
WATER STERILE IRR 1500ML POUR (IV SOLUTION) ×6 IMPLANT

## 2014-05-12 NOTE — Anesthesia Postprocedure Evaluation (Signed)
  Anesthesia Post-op Note  Patient: Miguel Ingram  Procedure(s) Performed: Procedure(s) (LRB): ROBOTIC ASSISTED LAPAROSCOPIC NEPHRECTOMY, POSSIBLE OPEN, EXTENSIVE ADHESIOLYSIS (Left)  Patient Location: PACU  Anesthesia Type: General  Level of Consciousness: awake and alert   Airway and Oxygen Therapy: Patient Spontanous Breathing  Post-op Pain: mild  Post-op Assessment: Post-op Vital signs reviewed, Patient's Cardiovascular Status Stable, Respiratory Function Stable, Patent Airway and No signs of Nausea or vomiting  Last Vitals:  Filed Vitals:   05/12/14 1334  BP: 166/68  Pulse: 68  Temp: 36.3 C  Resp:     Post-op Vital Signs: stable   Complications: No apparent anesthesia complications

## 2014-05-12 NOTE — H&P (Signed)
Miguel Ingram is an 63 y.o. male.    Chief Complaint: Pre-op Left Robotic v. Open Radical Nephrectomy  HPI:   1 - Large Left Renal Mass - 12cm enhancing left renal mass with central necrosis incidental on CT 03/2014 during admission for biliary obstruction. Appears 1 artery, 1 vein renovascular anatomy with some parasitics and enlarged gonadal vein. Cr <1.   PMH sig for chole, ERCP/stone retrieval, small umbilical hernia (reducible, after chole). No CV disease. No strong blood thinners. He is currently obtaining new PCP likely at [facility] in [place]. His sister [name] is very involved in his care.   Today [name] is seen to proceed with left robotic v. Open radical nephrectomy. No interval fevers.   Past Medical History  Diagnosis Date  . Psoriasis   . Hypertension     not diagnosed not on meds   . Pneumonia     hx of   . GERD (gastroesophageal reflux disease)   . Arthritis   . Hepatitis     hx of hepatitis A- 2011 ( approx)     Past Surgical History  Procedure Laterality Date  . Cholecystectomy    . Ercp N/A 03/13/2014    Procedure: ENDOSCOPIC RETROGRADE CHOLANGIOPANCREATOGRAPHY (ERCP);  Surgeon: Jeryl Columbia, MD;  Location: [facility] ENDOSCOPY;  Service: Endoscopy;  Laterality: N/A;    History reviewed. No pertinent family history. Social History:  reports that he has quit smoking. He has never used smokeless tobacco. He reports that he does not drink alcohol or use illicit drugs.  Allergies:  Allergies  Allergen Reactions  . Chocolate Other (See Comments)    Constipation.   . Shrimp [Shellfish Allergy] Nausea And Vomiting    Medications Prior to Admission  Medication Sig Dispense Refill  . ibuprofen (ADVIL,MOTRIN) 200 MG tablet Take 200 mg by mouth every 6 (six) hours as needed for headache or moderate pain.    . Multiple Vitamin (MULTIVITAMIN WITH MINERALS) TABS tablet Take 1 tablet by mouth every morning.     . carvedilol (COREG) 3.125 MG tablet Take 1 tablet (3.125 mg  total) by mouth 2 (two) times daily with a meal. (Patient not taking: Reported on 05/04/2014) 60 tablet 0  . HYDROcodone-acetaminophen (NORCO/VICODIN) 5-325 MG per tablet Take 1 tablet by mouth every 6 (six) hours as needed for moderate pain. (Patient not taking: Reported on 05/04/2014) 20 tablet 0  . ondansetron (ZOFRAN) 4 MG tablet Take 1 tablet (4 mg total) by mouth every 6 (six) hours as needed for nausea. (Patient not taking: Reported on 05/04/2014) 20 tablet 0    No results found for this or any previous visit (from the past 48 hour(s)). No results found.  Review of Systems  Constitutional: Negative.   HENT: Negative.   Eyes: Negative.   Respiratory: Negative.   Cardiovascular: Negative.   Gastrointestinal: Negative.  Negative for nausea, vomiting and abdominal pain.  Genitourinary: Negative.   Musculoskeletal: Negative.   Skin: Negative.   Neurological: Negative.   Endo/Heme/Allergies: Negative.   Psychiatric/Behavioral: Negative.     Blood pressure 154/90, pulse 67, temperature 97.5 F (36.4 C), temperature source Oral, resp. rate 18, height 6' (1.829 m), weight 95.255 kg (210 lb), SpO2 97 %. Physical Exam  Constitutional: He appears well-developed.  HENT:  Head: Normocephalic.  Eyes: Pupils are equal, round, and reactive to light.  Neck: Normal range of motion. Neck supple.  Cardiovascular: Normal rate.   Respiratory: Effort normal.  GI: Soft.  Genitourinary: Penis normal.  Musculoskeletal: Normal  range of motion.  Neurological: He is alert.  Skin: Skin is warm.  Psychiatric: He has a normal mood and affect. His behavior is normal. Judgment and thought content normal.     Assessment/Plan   1 - Large Left Renal Mass -  We redicussed the role of radical nephrectomy with the overall goal of complete surgical excision (negative margins) and better staging / diagnosis. We specifically rediscussed that with removal of the kidney there would be an overall renal function  decline with attendant risks of renal failure and need for dialysis in some cases, and need for kidney-friendly lifestyle post-op with excellent blood pressure and glycemic control. We then rediscussed surgical approaches including robotic and open techniques with robotic associated with a shorter convalescence. I reshowed the patient on their abdomen the approximately 4-6 incision (trocar) sites as well as presumed extraction sites with robotic approach as well as possible open incision sites. We specificallyre addressed that there may be need to alter operative plans according to intraopertive findings including conversion to open procedure. We rediscussed specific peri-operative risks including bleeding, infection, deep vein thrombosis, pulmonary embolism, compartment syndrome, nuropathy / neuropraxia, heart attack, stroke, death, as well as long-term risks such as non-cure / need for additional therapy and need for imaging and lab based post-op surveillance protocols. We rediscussed typical hospital course of approximately 2 day hospitalization, need for peri-operative drains / catheters, and typical post-hospital course with return to most non-strenuous activities by 2 weeks and ability to return to most jobs and more strenuous activity such as exercise by 6 weeks.    After this lengthy and detail discussion, including answering all of the patient's questions to their satisfaction, they have chosen to proceed with robotic vs. open left radical nephrectomy today as planned.  [name] 05/12/2014, 6:21 AM

## 2014-05-12 NOTE — Op Note (Signed)
NAMECHANEL, MCKESSON NO.:  [id number]  MEDICAL RECORD NO.:  [id number]  LOCATION:  6629                         FACILITY:  [facility]  PHYSICIAN:  Alexis Frock, MD     DATE OF BIRTH:  1950-07-07  DATE OF PROCEDURE: 05/12/2014  DATE OF DISCHARGE:                              OPERATIVE REPORT   DIAGNOSIS:  Large left renal mass.  PROCEDURES: 1. Robotic-assisted laparoscopic left radical nephrectomy. 2. Laparoscopic extensive adhesiolysis.  ASSISTANT:  Clemetine Marker, PA.  ESTIMATED BLOOD LOSS:  100 mL.  COMPLICATIONS:  None.  SPECIMEN:  Left radical nephrectomy.  DRAINS:  Foley catheter to straight drain.  FINDINGS: 1. Extensive omental adhesions throughout the abdominal cavity likely     reactive to large malignancy that is stating adhesiolysis with     upper and lower left hemiabdomen. 2. Single artery, single vein, left renovascular anatomy. 3. Severe desmoplastic reaction around the kidney with numerous     parasitic vessels and small lymphatic channels.  INDICATIONS:  Mr. Conely is a pleasant 63 year old gentleman who was found incidentally on workup of gastrointestinal complaints, have a very large left renal mass at least 12 cm in maximum diameter.  He underwent additional staging with CT scan.  Chest imaging was found to be clinically localized.  Options were discussed for management including surgery with caregivers palliative intent versus surveillance, purified protocols and he wished to proceed with left nephrectomy with minimally invasive assistance.  Given the very large nature of the mass, we discussed preoperatively possibility of conversion as well.  Informed consent was obtained and placed in the medical record.  PROCEDURE IN DETAIL:  The patient being [place], was verified. Procedure being left robotic radical nephrectomy was confirmed. Procedure was carried out.  Time-out was performed.  Intravenous antibiotics were  administered.  General endotracheal anesthesia was introduced.  The patient was placed into a left side up full flank position and flank 15 degrees stable flexion, superior arm elevator, sequential compression devices, bottom leg bent, top leg straight, and beanbag device was also used to help stabilize the patient 3.5-inch pressure points.  He was further fashioned on the operative table using 3-inch tape over foam padding across this chest and pelvis.  Sterile field was then created by prepping and draping the patient's entire left flank using chlorhexidine gluconate and this did allow access to the midline as well as umbilicus.  Next, a high-flow, low-pressure pneumoperitoneum was obtained using Veress technique and left lower quadrant having passed the aspiration and drop test.  A 12-mm robotic camera port was then placed in the paramedian and positioned approximately 1 handbreadth superolateral to the umbilicus. Laparoscopic examination of the peritoneal cavity revealed significant amount of dense omental adhesions throughout the left upper, mid and lower abdomen.  These were all appeared to be omental only without bowel and appeared to be likely reactive.  There were no obvious nodules worrisome for metastatic implants.  Additional ports were then placed as follows:  Left subcostal 8-mm robotic port, left far lateral 8-mm robotic port approximately one handbreadth superior to the anterior superior iliac spine and 8-mm robotic port in the inferior paramedian location approximately one  handbreadth superior to the pubis.  Two 12-mm assistant ports were placed in the midline; one 3 fingerbreadths above the camera port, one approximately 4 fingerbreadths below the camera port.  Robot was docked and passed through electronic checks.  Initial attention was directed to the extensive adhesiolysis.  Very careful adhesiolysis was performed using sharp and cautery dissection with cautery  scissors, taking down multiple omental adhesions from the anterior abdominal wall from the area of the superior abdomen towards the area of the pelvis, near the pelvis.  Some of these adhesions did tether the colon quite significantly laterally and these were carefully released with dissection well away from the colon.  This adhesiolysis took approximately 35 minutes.  Attention was then directed to development of the retroperitoneum.  Incision was made lateral to the descending colon from the area of the splenic flexure towards the area of the internal ring and the colon was carefully swept medially.  The retroperitoneum associated kidney was quite massive.  As expected, there was also a dense desmoplastic reaction that became more pronounced getting closer to the kidney as well as the hilum.  The lower pole of the kidney was identified, placed on gentle lateral traction. Dissection was proceeded medial to this.  The area of the psoas was identified.  Dissection proceeded within this triangle superiorly towards the renal hilum.  Fortunately, the renal hilum as aforementioned, the desmoplastic reaction became very very dense.  The ureter and gonadal were encountered and also placed laterally with the kidney.  The renal hilum was somewhat complex both negatively and due to the large mass.  There was a single artery and single vein; however, the artery originated behind the vein and then wrapped approximately 270 degrees around the anterior surface of the vein, brining this origin, so difficult to find.  The very careful dissection was performed in this location around the aorta to positively identify this crucial anatomy and it was identified as such.  The right renal artery was also positively identified as well to further allow visualization of the hilum and superior attachments were taken down releasing the splenorenal ligament and lateral splenic attachments letting this spleen  rotate superiorly and medially, this also allowed the pancreas and splenic vessels to carefully rotate medially away from the superior medial aspect of the hilum.  This allowed much clear identification of the hilar structures.  Clear window was developed around the left renal artery and it was controlled using extra large Hem-O-Lok proximal and vascular load stapler distally.  Vein was controlled using vascular load stapler, this resulted in excellent hemostatic control of the hilum. Superior attachments were taken down using vascular load stapler keeping the left adrenal with the nephrectomy specimen.  Superior attachments were taken down releasing the superior aspect of the kidney away from the diaphragm, taking great care to avoid diaphragmatic injury.  A small incidental hiatal hernia was noted, but did not primarily repaired as appeared, especially caliber not to promote strangulation.  Next, lateral attachments were taken down and finally inferiorly, the gonadal vessels were taken using vascular load stapler as was the ureter.  This completely freed up the quite massive left radical nephrectomy specimen. An attempt was made to place this into extra large endoscopic retrieval bag on laparoscopically.  However, the specimen was much too large to be adequately placed into this.  However, it was partially placed with above the lower third of the specimen into it.  Robot was then undocked. Specimen was retrieved by connecting the  previous two 12-mm assistant port in the midline and very carefully manipulating the kidney on its long axis out of the abdomen and setting aside for permanent pathology. Inspection of the extraction site revealed excellent hemostasis.  Sponge and needle counts were correct.  No obvious distal injuries detected. The previous 2-mm robotic port sites were closed at the level of the fascia using figure-of-eight Vicryl.  The extraction site, which inherently  included the previous 12-mm assistant port in the midline was closed at the level of the fascia using figure-of-eight PDS x9, followed by reapproximation of Scarpa's using Vicryl.  All incision sites were infiltrated with dilute lyophilized Marcaine and closed the level of the skin using subcuticular Monocryl followed by Dermabond.  Procedure was then terminated.  The patient tolerated the procedure well.  There were no immediate periprocedural complications.  The patient was taken to the Postanesthesia Care Unit in stable condition.          ______________________________ Alexis Frock, MD     TM/MEDQ  D:  05/12/2014  T:  05/12/2014  Job:  [id number]

## 2014-05-12 NOTE — Progress Notes (Signed)
CARE MANAGEMENT NOTE 05/12/2014  Patient:  Miguel Ingram, Miguel Ingram   Account Number:  [id number]  Date Initiated:  05/12/2014  Documentation initiated by:  Karl Bales  Subjective/Objective Assessment:   pt admitted for surgery     Action/Plan:   from home   Anticipated DC Date:  05/15/2014   Anticipated DC Plan:  HOME/SELF CARE         Choice offered to / List presented to:             Status of service:  In process, will continue to follow Medicare Important Message given?   (If response is "NO", the following Medicare IM given date fields will be blank) Date Medicare IM given:   Medicare IM given by:   Date Additional Medicare IM given:   Additional Medicare IM given by:    Discharge Disposition:    Per UR Regulation:  Reviewed for med. necessity/level of care/duration of stay  If discussed at [place] of Stay Meetings, dates discussed:    Comments:  05/12/14 MMcGibboney, RN, BSN Chart reviewed.

## 2014-05-12 NOTE — Brief Op Note (Signed)
05/12/2014  11:58 AM  PATIENT:  Miguel Ingram  63 y.o. male  PRE-OPERATIVE DIAGNOSIS:  LARGE LEFT RENAL MASS  POST-OPERATIVE DIAGNOSIS:  * No post-op diagnosis entered *  PROCEDURE:  Procedure(s): ROBOTIC ASSISTED LAPAROSCOPIC NEPHRECTOMY, POSSIBLE OPEN, EXTENSIVE ADHESIOLYSIS (Left)  SURGEON:  Surgeon(s) and Role:    * Junious Dresser, MD - Primary  PHYSICIAN ASSISTANT:   ASSISTANTS: Clemetine Marker PA   ANESTHESIA:   local and general  EBL:  Total I/O In: 1000 [I.V.:1000] Out: 275 [Urine:225; Blood:50]  BLOOD ADMINISTERED:none  DRAINS: foley to straight drain   LOCAL MEDICATIONS USED:  NONE  SPECIMEN:  Source of Specimen:  Left Radical Nephrectomy  DISPOSITION OF SPECIMEN:  PATHOLOGY  COUNTS:  YES  TOURNIQUET:  * No tourniquets in log *  DICTATION: .Other Dictation: Dictation Number [id number]  PLAN OF CARE: Admit to inpatient   PATIENT DISPOSITION:  PACU - hemodynamically stable.   Delay start of Pharmacological VTE agent (>24hrs) due to surgical blood loss or risk of bleeding: yes

## 2014-05-12 NOTE — Transfer of Care (Signed)
Immediate Anesthesia Transfer of Care Note  Patient: Miguel Ingram  Procedure(s) Performed: Procedure(s): ROBOTIC ASSISTED LAPAROSCOPIC NEPHRECTOMY, POSSIBLE OPEN, EXTENSIVE ADHESIOLYSIS (Left)  Patient Location: PACU  Anesthesia Type:General  Level of Consciousness: awake, alert  and oriented  Airway & Oxygen Therapy: Patient Spontanous Breathing and Patient connected to face mask oxygen  Post-op Assessment: Report given to PACU RN and Post -op Vital signs reviewed and stable  Post vital signs: Reviewed and stable  Complications: No apparent anesthesia complications

## 2014-05-13 LAB — HEMOGLOBIN AND HEMATOCRIT, BLOOD
HEMATOCRIT: 40.9 % (ref 39.0–52.0)
Hemoglobin: 13.3 g/dL (ref 13.0–17.0)

## 2014-05-13 LAB — BASIC METABOLIC PANEL
Anion gap: 11 (ref 5–15)
BUN: 13 mg/dL (ref 6–23)
CO2: 25 mEq/L (ref 19–32)
Calcium: 8.4 mg/dL (ref 8.4–10.5)
Chloride: 98 mEq/L (ref 96–112)
Creatinine, Ser: 1.36 mg/dL — ABNORMAL HIGH (ref 0.50–1.35)
GFR calc Af Amer: 62 mL/min — ABNORMAL LOW (ref >90)
GFR, EST NON AFRICAN AMERICAN: 53 mL/min — AB (ref >90)
GLUCOSE: 149 mg/dL — AB (ref 70–99)
POTASSIUM: 4.3 meq/L (ref 3.7–5.3)
Sodium: 134 mEq/L — ABNORMAL LOW (ref 137–147)

## 2014-05-13 NOTE — Progress Notes (Signed)
Urology Inpatient Progress Report S/p left lap radical nephrectomy  Intv/Subj: No acute events overnight. Patient is without complaint. Foley out, hasn't voided yet Pain reasonably well controlled Hasn't walked yet  Past Medical History  Diagnosis Date  . Psoriasis   . Hypertension     not diagnosed not on meds   . Pneumonia     hx of   . GERD (gastroesophageal reflux disease)   . Arthritis   . Hepatitis     hx of hepatitis A- 2011 ( approx)    Current Facility-Administered Medications  Medication Dose Route Frequency Provider Last Rate Last Dose  . acetaminophen (TYLENOL) tablet 1,000 mg  1,000 mg Oral 4 times per day Debbrah Alar, PA-C   1,000 mg at 05/13/14 6295  . carvedilol (COREG) tablet 3.125 mg  3.125 mg Oral BID WC Junious Dresser, MD   3.125 mg at 05/13/14 0818  . dextrose 5 %-0.45 % sodium chloride infusion   Intravenous Continuous Junious Dresser, MD 75 mL/hr at 05/13/14 0715    . HYDROmorphone (DILAUDID) injection 0.5-1 mg  0.5-1 mg Intravenous Q2H PRN Debbrah Alar, PA-C   1 mg at 05/12/14 1726  . lactated ringers infusion   Intravenous Continuous Amy Carlyn Reichert, CRNA      . ondansetron (ZOFRAN) injection 4 mg  4 mg Intravenous Q4H PRN Junious Dresser, MD   4 mg at 05/13/14 1032  . oxyCODONE (Oxy IR/ROXICODONE) immediate release tablet 5 mg  5 mg Oral Q4H PRN Debbrah Alar, PA-C   5 mg at 05/13/14 1032  . senna-docusate (Senokot-S) tablet 1 tablet  1 tablet Oral BID Junious Dresser, MD   1 tablet at 05/13/14 0818     Objective: Vital: Filed Vitals:   05/12/14 1334 05/12/14 2226 05/13/14 0314 05/13/14 0538  BP: 166/68 133/70 123/65 134/72  Pulse: 68 84 79 74  Temp: 97.4 F (36.3 C) 97.8 F (36.6 C) 97.6 F (36.4 C) 97.7 F (36.5 C)  TempSrc: Oral Oral Oral Oral  Resp:  20 20 20   Height: 6' (1.829 m)     Weight: 95.255 kg (210 lb)     SpO2: 99% 100% 99% 100%   I/Os: I/O last 3 completed shifts: In: 2808.3 [I.V.:2808.3] Out: 1330  [Urine:1280; Blood:50]  Physical Exam:  General: Patient is in no apparent distress Lungs: Normal respiratory effort, chest expands symmetrically. GI: The abdomen is soft and nontender without mass.  Incisions are c/d/i Ext: lower extremities symmetric  Lab Results:  Recent Labs  05/12/14 1238 05/13/14 0425  HGB 14.0 13.3  HCT 41.9 40.9    Recent Labs  05/13/14 0425  NA 134*  K 4.3  CL 98  CO2 25  GLUCOSE 149*  BUN 13  CREATININE 1.36*  CALCIUM 8.4   No results for input(s): LABPT, INR in the last 72 hours. No results for input(s): LABURIN in the last 72 hours. No results found for this or any previous visit.  Studies/Results:   Assessment: 1 Day Post-Op - stable  Plan: ADAT MIVF PO pain meds Patient needs to ambulate and get moving. Would expect that he'll be ready for discharge tomorrow.  Louis Meckel W 05/13/2014, 11:49 AM

## 2014-05-14 MED ORDER — OXYCODONE HCL 5 MG PO TABS: 5.0000 mg | ORAL_TABLET | ORAL | Status: AC | PRN

## 2014-05-14 MED ORDER — SENNOSIDES-DOCUSATE SODIUM 8.6-50 MG PO TABS: 1.0000 | ORAL_TABLET | Freq: Two times a day (BID) | ORAL | Status: AC

## 2014-05-14 MED ORDER — ONDANSETRON HCL 4 MG PO TABS: 4.0000 mg | ORAL_TABLET | Freq: Three times a day (TID) | ORAL | Status: AC | PRN

## 2014-05-14 NOTE — Discharge Summary (Addendum)
Date of admission: 05/12/2014  Date of discharge: 05/14/2014  Admission diagnosis: Left renal mass  Discharge diagnosis: Same  Secondary diagnoses: None  History and Physical: For full details, please see admission history and physical. Briefly, Miguel Ingram is a 63 y.o. year old patient with large left renal mass.   Hospital Course: Patient taken to the OR on Friday 12/11 for left radical nephrectomy which he tolerated well.  By POD#2, he was ambulating and pain was well controlled with oral narcotics.  He did have some intermittent nausea which he reports is not atypical since his cholecystectomy.  He did tolerate his small breakfast on day of discharge.    He suffered from urinary retention on POD#1 and foley catheter had to be replaced with 730m in bladder at that time.  By POD#2 he has met all criteria for discharge.  Laboratory values:  Recent Labs  05/12/14 1238 05/13/14 0425  HGB 14.0 13.3  HCT 41.9 40.9    Recent Labs  05/13/14 0425  CREATININE 1.36*    Disposition: Home  Discharge instruction: The patient was instructed to be ambulatory but told to refrain from heavy lifting, strenuous activity, or driving.  Discharge medications:    Medication List    STOP taking these medications        HYDROcodone-acetaminophen 5-325 MG per tablet  Commonly known as:  NORCO/VICODIN     ibuprofen 200 MG tablet  Commonly known as:  ADVIL,MOTRIN     multivitamin with minerals Tabs tablet      TAKE these medications        carvedilol 3.125 MG tablet  Commonly known as:  COREG  Take 1 tablet (3.125 mg total) by mouth 2 (two) times daily with a meal.     ondansetron 4 MG tablet  Commonly known as:  ZOFRAN  Take 1 tablet (4 mg total) by mouth every 6 (six) hours as needed for nausea.     ondansetron 4 MG tablet  Commonly known as:  ZOFRAN  Take 1 tablet (4 mg total) by mouth every 8 (eight) hours as needed for nausea or vomiting.     oxyCODONE 5 MG immediate  release tablet  Commonly known as:  Oxy IR/ROXICODONE  Take 1 tablet (5 mg total) by mouth every 4 (four) hours as needed for moderate pain.     senna-docusate 8.6-50 MG per tablet  Commonly known as:  Senokot-S  Take 1 tablet by mouth 2 (two) times daily.        Followup:      Follow-up Information    Follow up with MAlexis Frock MD On 05/25/2014.   Specialty:  Urology   Why:  at 10:00   Contact information:   [phone]     Final Discharge Diagnosis - Stage 3 renal cell carcinoma.

## 2014-05-14 NOTE — Progress Notes (Signed)
Urology Inpatient Progress Report S/p left lap radical nephrectomy  Intv/Subj: Urinary retention last night with 764ml in bladder - foley replaced Vomiting this morning after taking oxycodone prior to breakfast Ambulating well Pain well controlled   Past Medical History  Diagnosis Date  . Psoriasis   . Hypertension     not diagnosed not on meds   . Pneumonia     hx of   . GERD (gastroesophageal reflux disease)   . Arthritis   . Hepatitis     hx of hepatitis A- 2011 ( approx)    Current Facility-Administered Medications  Medication Dose Route Frequency Provider Last Rate Last Dose  . carvedilol (COREG) tablet 3.125 mg  3.125 mg Oral BID WC Junious Dresser, MD   3.125 mg at 05/14/14 1478  . HYDROmorphone (DILAUDID) injection 0.5-1 mg  0.5-1 mg Intravenous Q2H PRN Debbrah Alar, PA-C   1 mg at 05/14/14 0559  . lactated ringers infusion   Intravenous Continuous Amy Carlyn Reichert, CRNA      . ondansetron (ZOFRAN) injection 4 mg  4 mg Intravenous Q4H PRN Junious Dresser, MD   4 mg at 05/14/14 0559  . oxyCODONE (Oxy IR/ROXICODONE) immediate release tablet 5 mg  5 mg Oral Q4H PRN Debbrah Alar, PA-C   5 mg at 05/14/14 0520  . senna-docusate (Senokot-S) tablet 1 tablet  1 tablet Oral BID Junious Dresser, MD   1 tablet at 05/14/14 2956     Objective: Vital: Filed Vitals:   05/13/14 0538 05/13/14 1417 05/13/14 2135 05/14/14 0416  BP: 134/72 146/66 129/72 134/69  Pulse: 74 121 69 72  Temp: 97.7 F (36.5 C) 98 F (36.7 C) 97.8 F (36.6 C) 97.8 F (36.6 C)  TempSrc: Oral Oral Oral Oral  Resp: 20 18 20 18   Height:      Weight:      SpO2: 100% 100% 100% 97%   I/Os: I/O last 3 completed shifts: In: 3865.8 [P.O.:360; I.V.:3505.8] Out: 2130 [Urine:2130]  Physical Exam:  General: Patient is in no apparent distress Lungs: Normal respiratory effort, chest expands symmetrically. GI: The abdomen is soft and nontender without mass.  Incisions are c/d/i Ext: lower extremities  symmetric  Lab Results:  Recent Labs  05/12/14 1238 05/13/14 0425  HGB 14.0 13.3  HCT 41.9 40.9    Recent Labs  05/13/14 0425  NA 134*  K 4.3  CL 98  CO2 25  GLUCOSE 149*  BUN 13  CREATININE 1.36*  CALCIUM 8.4   No results for input(s): LABPT, INR in the last 72 hours. No results for input(s): LABURIN in the last 72 hours. No results found for this or any previous visit.  Studies/Results:   Assessment: 2 Days Post-Op - stable  Plan: Recovering well Vomiting may have been due to oxycodone on empty stomach Continue to ambulate Will have regular breakfast and re-evaluate later this morning Should be ok for discharge later today assuming he is able to tolerate diet Will be discharged with foley and undergo trial of void as outpatient   Baltazar Najjar, Will 05/14/2014, 8:41 AM

## 2014-05-14 NOTE — Discharge Instructions (Signed)

## 2014-05-15 ENCOUNTER — Encounter (HOSPITAL_COMMUNITY): Payer: Self-pay | Admitting: Urology

## 2014-05-22 ENCOUNTER — Emergency Department: Payer: Self-pay | Admitting: Emergency Medicine

## 2014-05-22 LAB — COMPREHENSIVE METABOLIC PANEL
ALBUMIN: 2.7 g/dL — AB (ref 3.4–5.0)
ALK PHOS: 69 U/L
Anion Gap: 8 (ref 7–16)
BILIRUBIN TOTAL: 1.1 mg/dL — AB (ref 0.2–1.0)
BUN: 21 mg/dL — AB (ref 7–18)
CHLORIDE: 101 mmol/L (ref 98–107)
CO2: 25 mmol/L (ref 21–32)
CREATININE: 1.78 mg/dL — AB (ref 0.60–1.30)
Calcium, Total: 8.3 mg/dL — ABNORMAL LOW (ref 8.5–10.1)
EGFR (African American): 50 — ABNORMAL LOW
EGFR (Non-African Amer.): 41 — ABNORMAL LOW
Glucose: 134 mg/dL — ABNORMAL HIGH (ref 65–99)
OSMOLALITY: 273 (ref 275–301)
Potassium: 4.1 mmol/L (ref 3.5–5.1)
SGOT(AST): 17 U/L (ref 15–37)
SGPT (ALT): 16 U/L
SODIUM: 134 mmol/L — AB (ref 136–145)
Total Protein: 6.2 g/dL — ABNORMAL LOW (ref 6.4–8.2)

## 2014-05-22 LAB — CBC
HCT: 49.1 % (ref 40.0–52.0)
HGB: 16 g/dL (ref 13.0–18.0)
MCH: 29.5 pg (ref 26.0–34.0)
MCHC: 32.7 g/dL (ref 32.0–36.0)
MCV: 90 fL (ref 80–100)
PLATELETS: 308 10*3/uL (ref 150–440)
RBC: 5.44 10*6/uL (ref 4.40–5.90)
RDW: 13.7 % (ref 11.5–14.5)
WBC: 7.5 10*3/uL (ref 3.8–10.6)

## 2014-05-27 LAB — CULTURE, BLOOD (SINGLE)

## 2014-08-01 DEATH — deceased

## 2016-11-06 IMAGING — CT CT ABD-PELV W/O CM
2 of 4 series · 17 of 46 positions shown, 19 images · non-contrast
Comparison: CT 05/22/2014, 03/12/2014

CLINICAL DATA: Abdominal pain following left robotic assisted
laparoscopic nephrectomy

EXAM:
CT ABDOMEN AND PELVIS WITHOUT CONTRAST
TECHNIQUE: Multidetector CT imaging of the abdomen and pelvis was performed
following the standard protocol without IV contrast.

[Series 2: routine abd pel without · axial · non-contrast · 0.93mm/px · z∈[-1012,-592]mm · 14 of 92 slices shown, 16 images]
[im 4/92  soft-tissue]
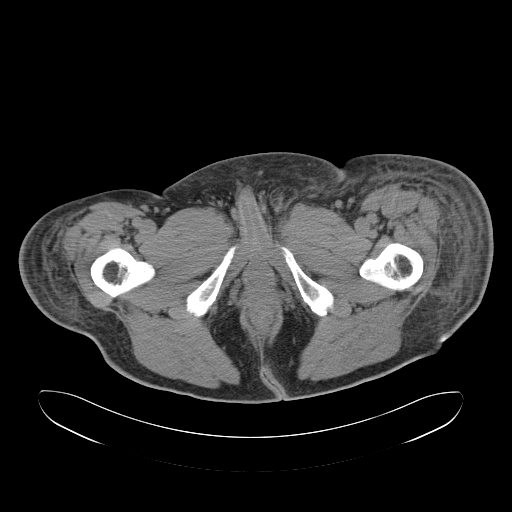
[im 4/92  bone]
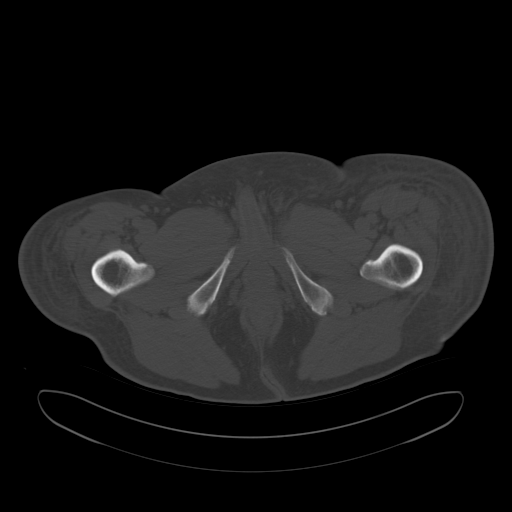
[im 11/92  soft-tissue]
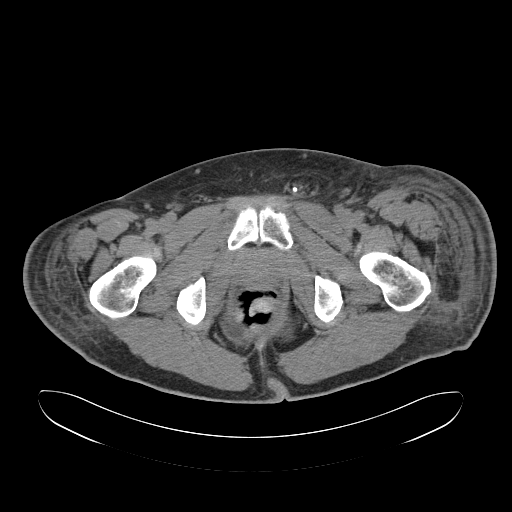
[im 19/92  soft-tissue]
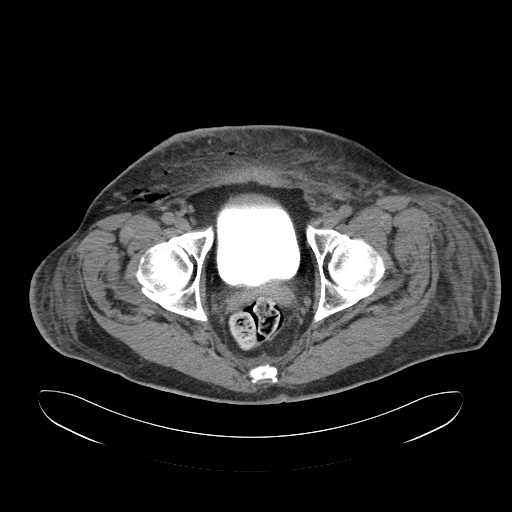
[im 26/92  soft-tissue]
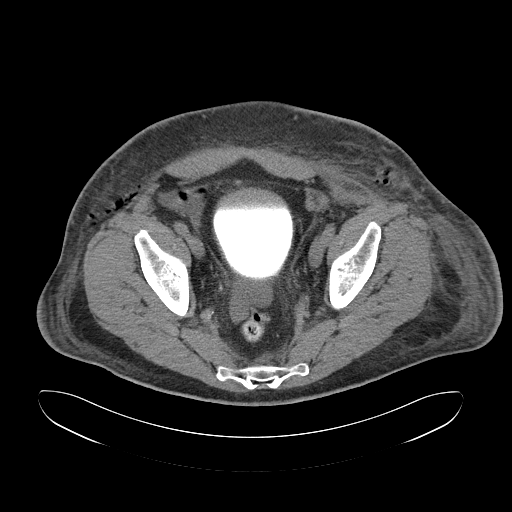
[im 30/92  soft-tissue]
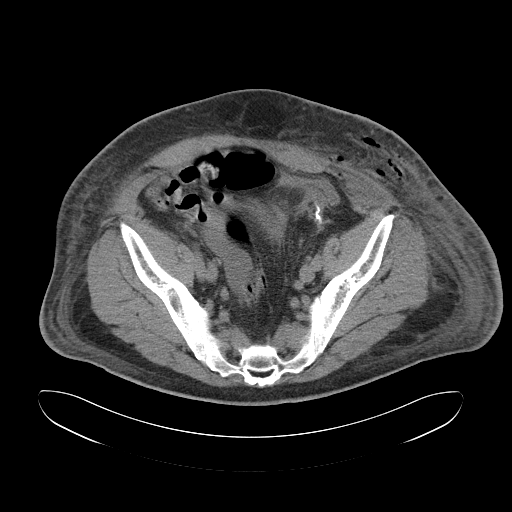
[im 37/92  soft-tissue]
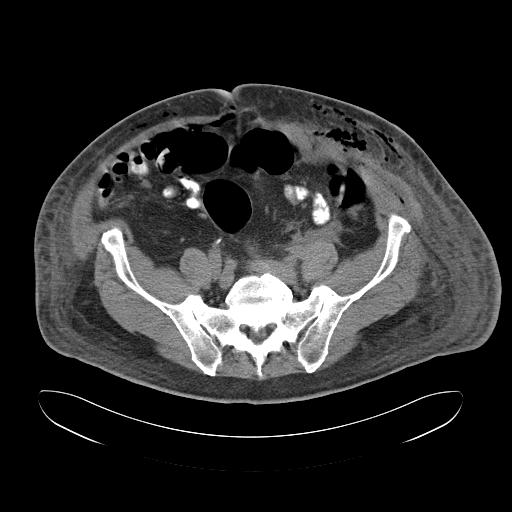
[im 44/92  soft-tissue]
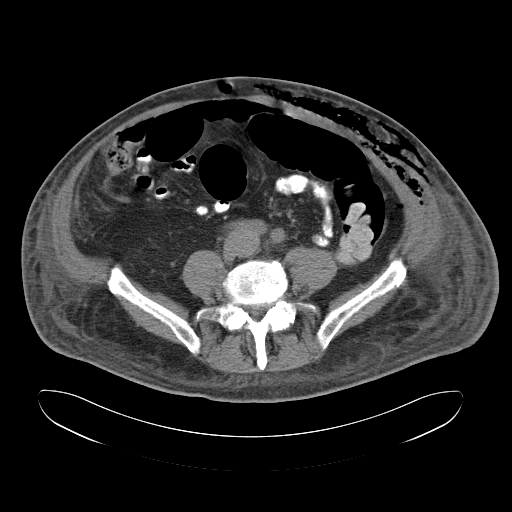
[im 48/92  soft-tissue]
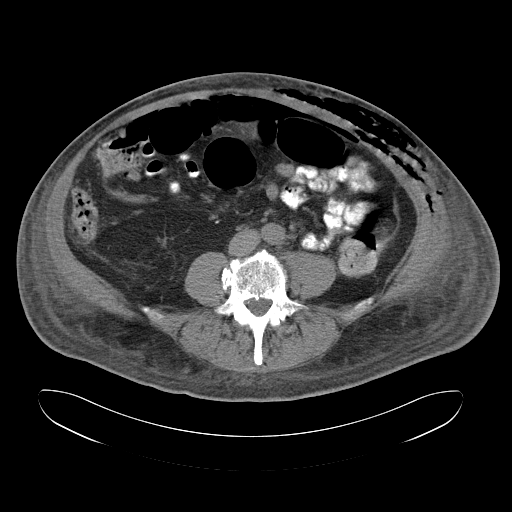
[im 55/92  soft-tissue]
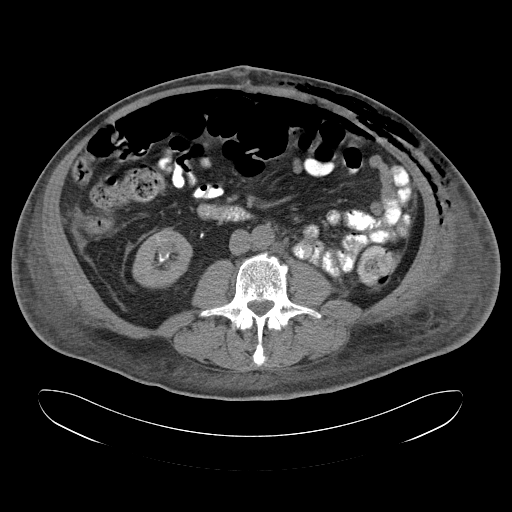
[im 55/92  bone]
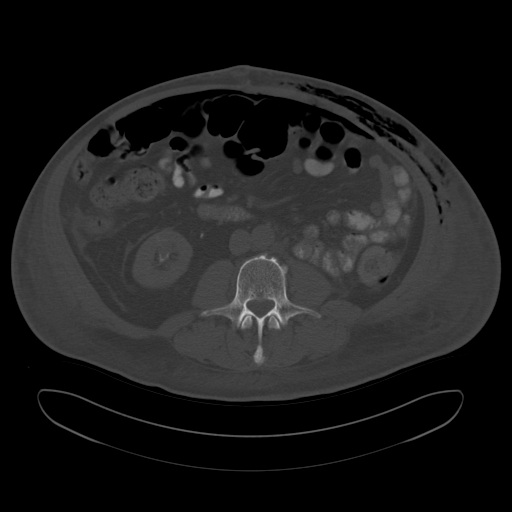
[im 62/92  soft-tissue]
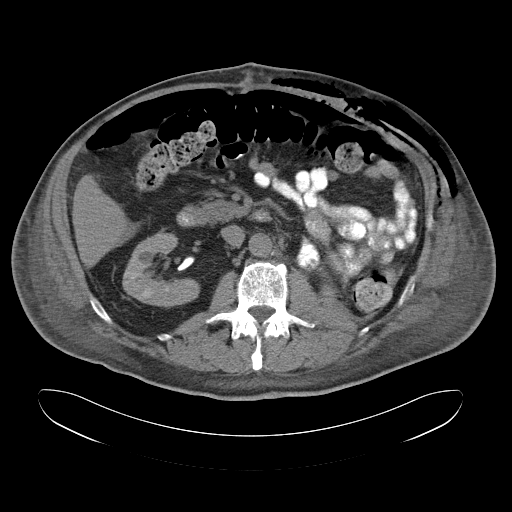
[im 70/92  soft-tissue]
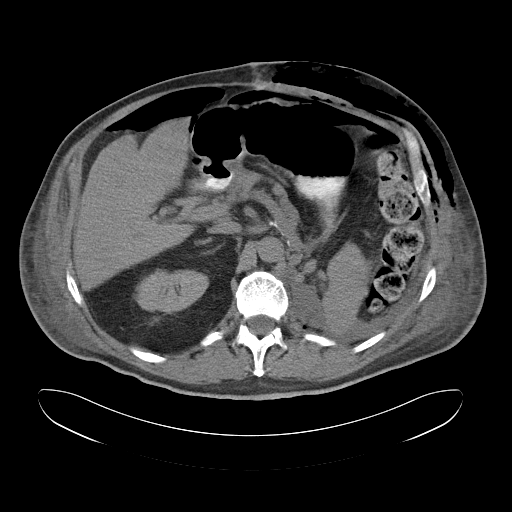
[im 73/92  soft-tissue]
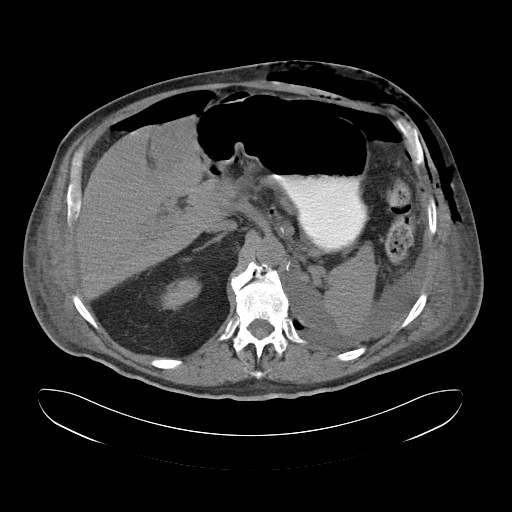
[im 81/92  soft-tissue]
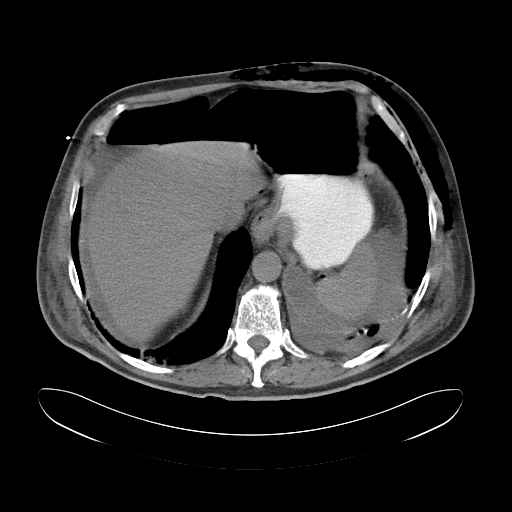
[im 88/92  soft-tissue]
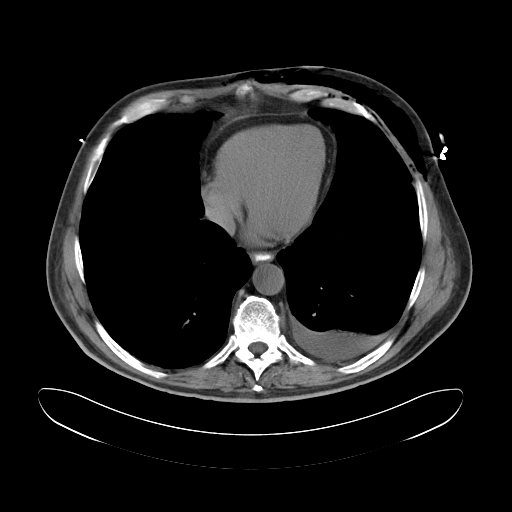

[Series 5: cor routine abd pel wo · coronal · 0.90mm/px · 3 of 168 slices shown]
[im 56/168  soft-tissue]
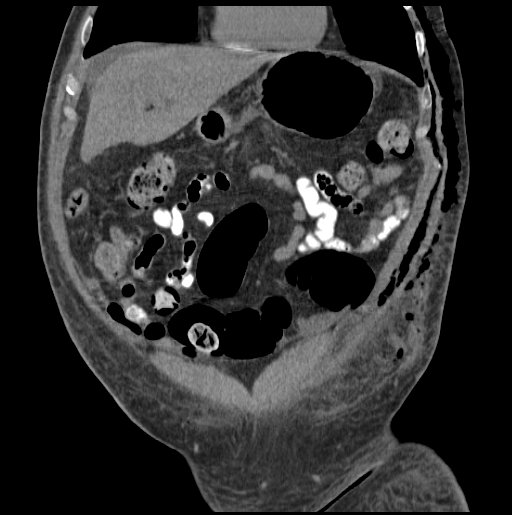
[im 75/168  soft-tissue]
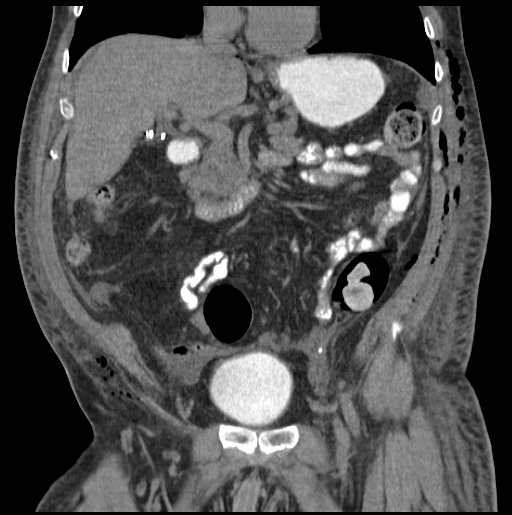
[im 93/168  soft-tissue]
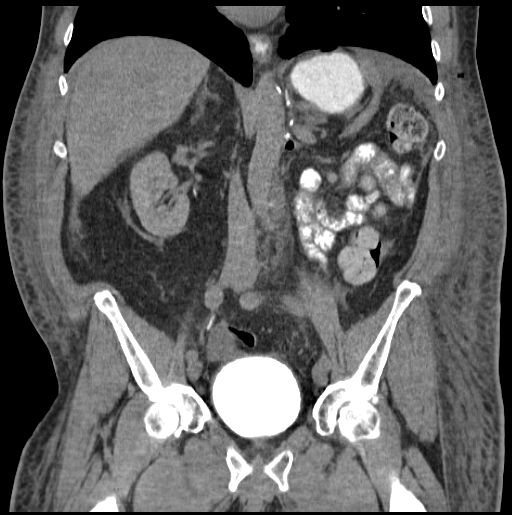

[17 of 46 positions shown; findings below may reference images not displayed]

FINDINGS: Patient was re scanned after administration of oral contrast. There
is no discrete localization of leakage of the oral contrast from the
stomach, small bowel colon. However, in examination of the free
fluid in the left upper quadrant the density has increased. For
example density of this fluid measures proximity 15 Hounsfield units
or previously fluid measured 0. This could potentially indicate a
occult leakage of oral contrast and increase in density of fluid.

Again demonstrated a large volume of intraperitoneal free air. There
is scattered gas along the stomach small bowel and colon.

There is a gas and fluid collection in a posterior diaphragmatic
hernia seen on image 12, series 2. This hernia is demonstrated on
pre surgical scan of 03/12/2014.

Again demonstrated a significant volume of gas dissecting along the
left abdominal wall.
IMPRESSION: 1. No obvious leakage of the oral contrast from the stomach, small
bowel or colon colon. There is however; increased density of the
free fluid in the left upper abdomen. This could indicate occult
leakage of the oral contrast with dilution in the free fluid.
2. No significant change in large volume intraperitoneal free air
and gas dissecting along the left abdominal wall.
3. Recommend repeat CT scan in several hours to see if there is
continued increase in density of the intraperitoneal free fluid.
Findings conveyed Kito Pell on 05/22/2014  at[DATE].

## 2016-11-06 IMAGING — CT CT ABD-PELV W/ CM
2 of 4 series · 16 of 46 positions shown, 18 images · IV contrast (isovue)
Comparison: CT 03/12/2014

CLINICAL DATA: Left nephrectomy a 05/12/2014 for renal cell
carcinoma. Yellow drainage from incision site

EXAM:
CT ABDOMEN AND PELVIS WITH CONTRAST
TECHNIQUE: Multidetector CT imaging of the abdomen and pelvis was performed
using the standard protocol following bolus administration of
intravenous contrast.
CONTRAST:  75 mL Isovue

[Series 2: routine abd pel with · axial · 0.87mm/px · z∈[-1092,-702]mm · 13 of 90 slices shown, 15 images]
[im 6/90  soft-tissue]
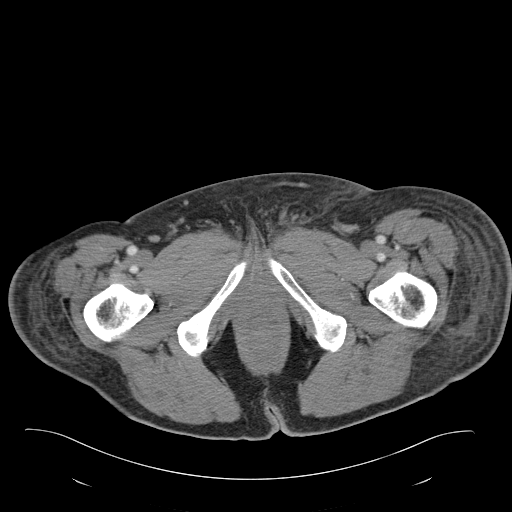
[im 6/90  bone]
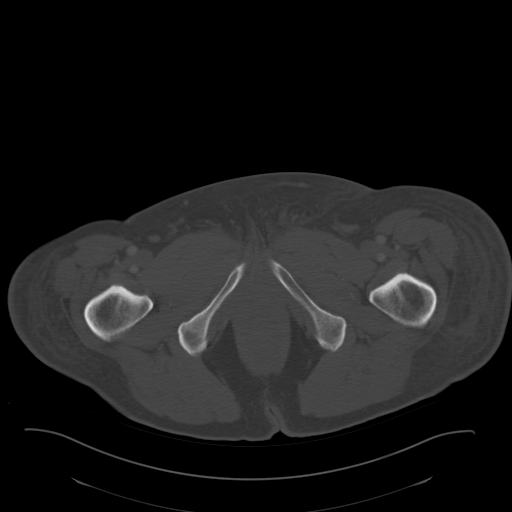
[im 12/90  soft-tissue]
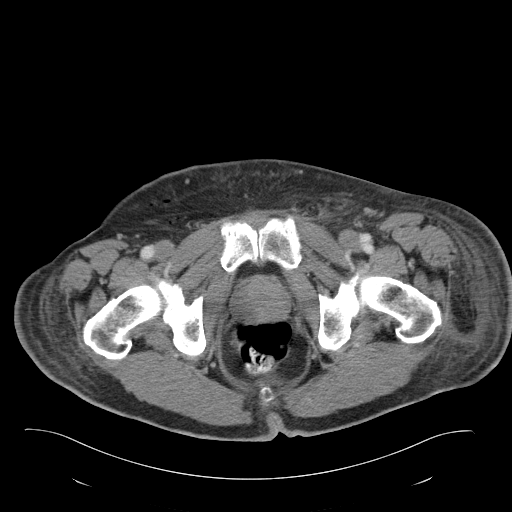
[im 18/90  soft-tissue]
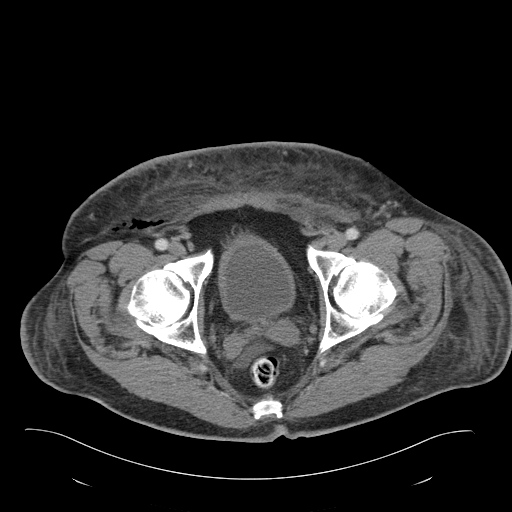
[im 24/90  soft-tissue]
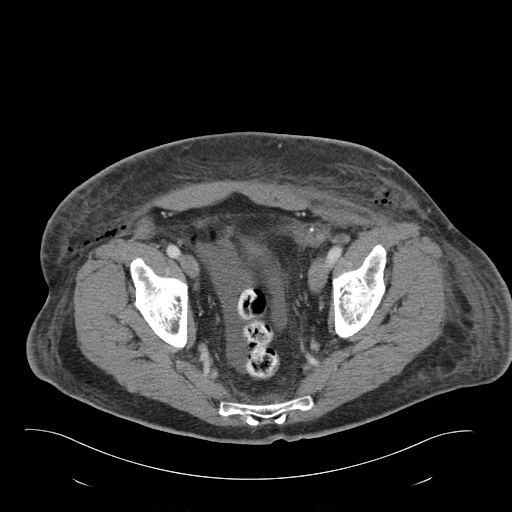
[im 30/90  soft-tissue]
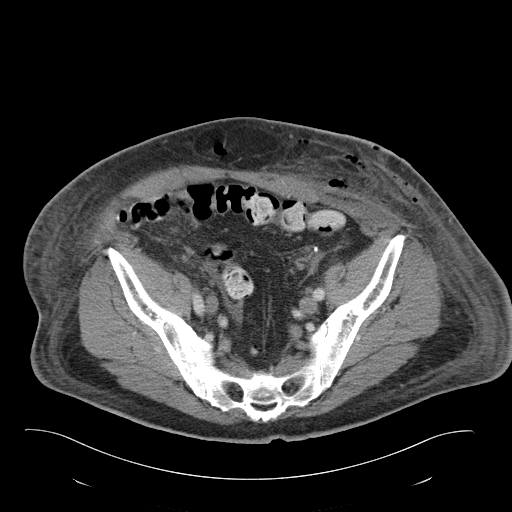
[im 36/90  soft-tissue]
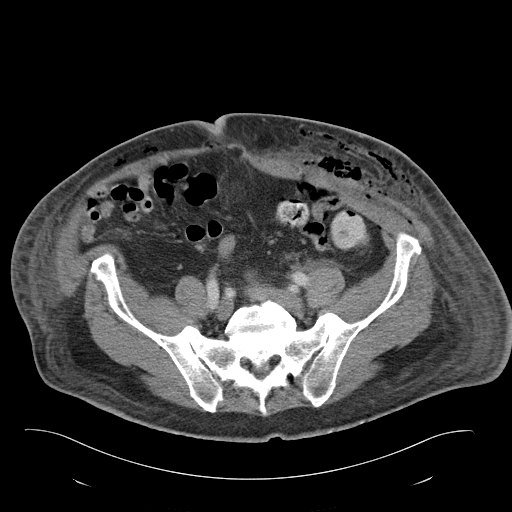
[im 48/90  soft-tissue]
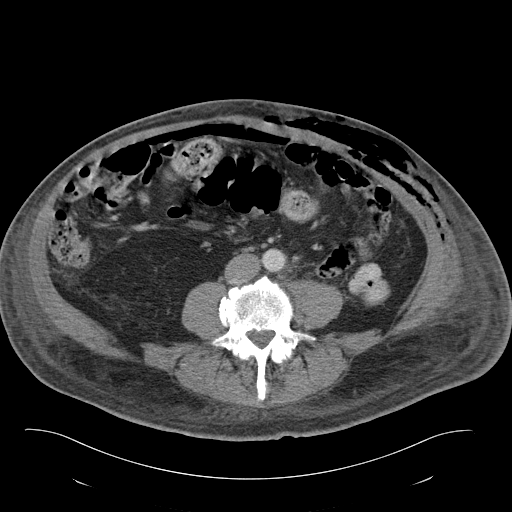
[im 54/90  soft-tissue]
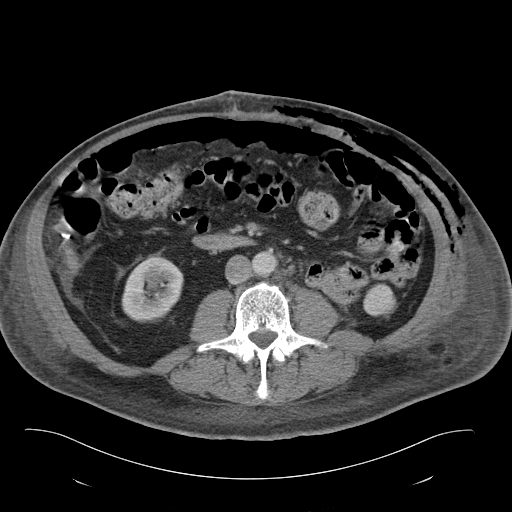
[im 60/90  soft-tissue]
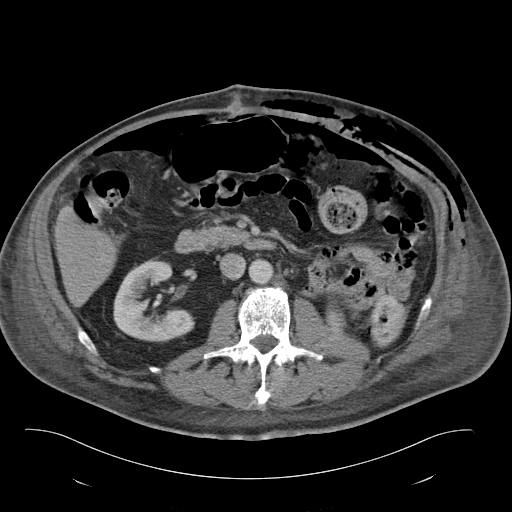
[im 60/90  bone]
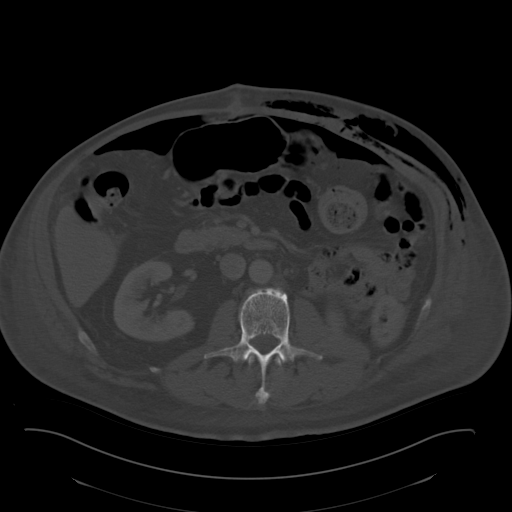
[im 66/90  soft-tissue]
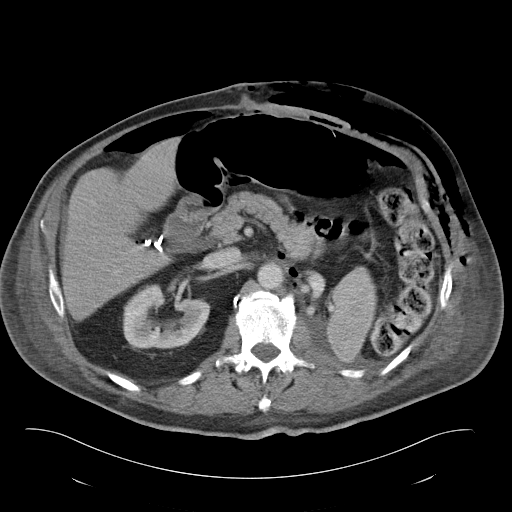
[im 72/90  soft-tissue]
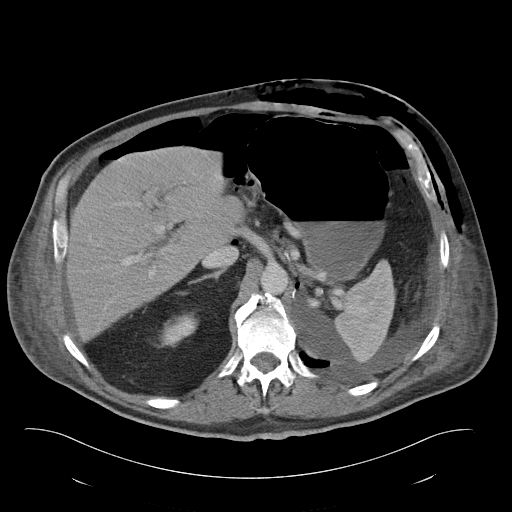
[im 78/90  soft-tissue]
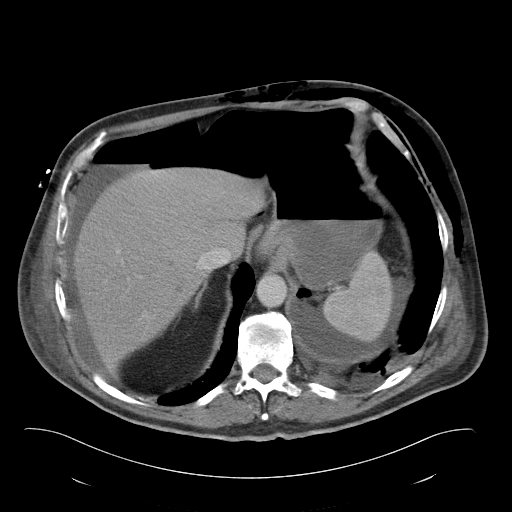
[im 84/90  soft-tissue]
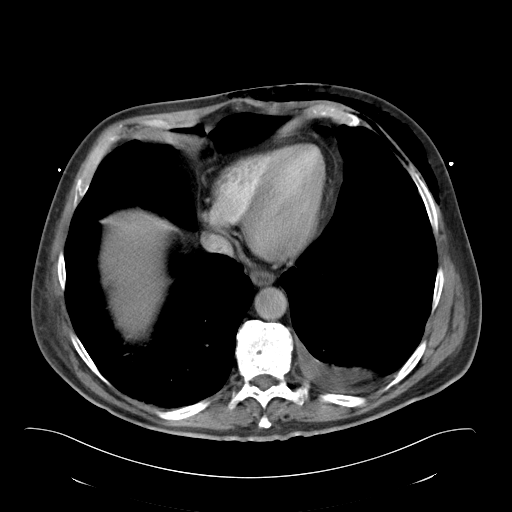

[Series 6: cor routine abd pel with · coronal · 0.89mm/px · 3 of 157 slices shown]
[im 53/157  soft-tissue]
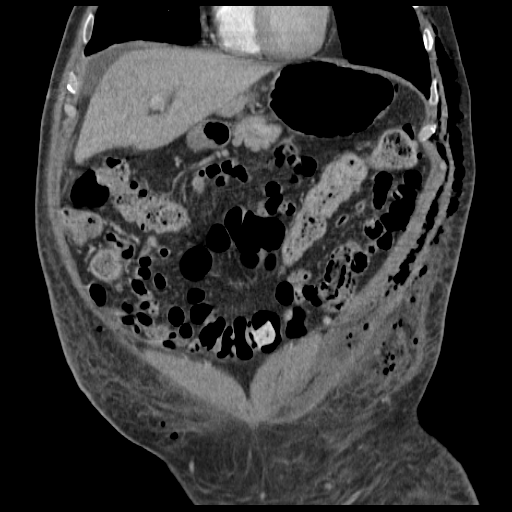
[im 70/157  soft-tissue]
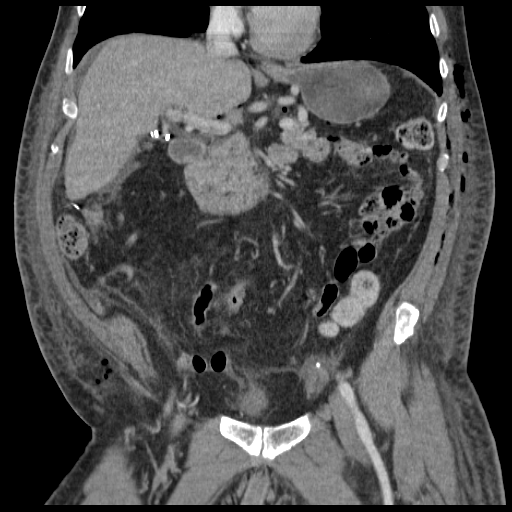
[im 87/157  soft-tissue]
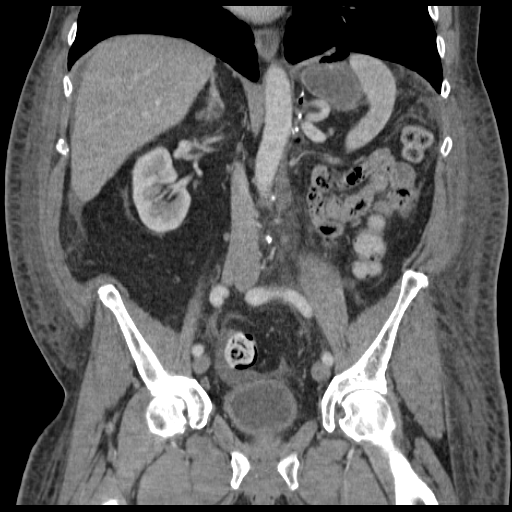

[16 of 46 positions shown; findings below may reference images not displayed]

FINDINGS: Lower chest: There is mild atelectasis at the left lung base. No
pneumothorax or pleural fluid. No pericardial effusion.

Hepatobiliary: No focal hepatic lesion. Post cholecystectomy. Small
hypodensities in the liver characterized as a benign cyst on
comparison MRI.

Pancreas: Pancreas is normal. No ductal dilatation. No pancreatic
inflammation.

Spleen: Normal spleen.

Adrenals/urinary tract: Interval left nephrectomy and adrenalectomy.
No evidence of abscess or organized fluid collection in the
nephrectomy bed. There is large volume of intraperitoneal free air
collecting non dependently within the peritoneal space. Some Gas and
fluid adjacent to the spleen in the left upper quadrant. Gas
adjacent to the left adrenalectomy clips.

Large volume of gas within the soft tissues of the left abdominal
wall. Gas dissects within the muscle and facial planes and within
the subcutaneous compartment and extends to the upper margin of the
study (lower chest) and inferiorly to the left groin region.
Interspersed gas also noted in the subcutaneous tissues of the right
groin. There is no organized fluid collection.

Right adrenal gland kidney are normal. Right ureter bladder are
normal. Tiny foci of gas within the bladder.

Stomach/Bowel: Stomach, small bowel, and colon are unremarkable.
Rectum is normal.

Vascular/Lymphatic: Abdominal aorta is normal caliber. There is no
retroperitoneal or periportal lymphadenopathy. No pelvic
lymphadenopathy.

Reproductive: Prostate gland is normal.

Musculoskeletal: No aggressive osseous lesion.
IMPRESSION: 1. Large volume of intraperitoneal free air. This is larger than
expected following surgery. Concern for potential bowel injury with
intraperitoneal free air. There is a small amount of fluid in the
upper abdomen adjacent to spleen and liver. Cannot exclude GI tract
is source of this fluid.
2. Large amount of gas within the left abdominal wall following left
nephrectomy. Gas dissects within the muscle planes and within the
subcutaneous tissues. This of volume of gas is more than the
expected a 10 days after surgery. Concern for communication of the
abdominal wall gas with the intraperitoneal gas potentially through
a laparoscopic port
3. No evidence of formed abscess or fluid collection within the
nephrectomy bed, peritoneal space, or abdominal wall.
